# Patient Record
Sex: Female | Born: 1965
Health system: Southern US, Community
[De-identification: ages and names within clinical notes are randomized; demographics above are authoritative.]

## PROBLEM LIST (undated history)

## (undated) DIAGNOSIS — G509 Disorder of trigeminal nerve, unspecified: Secondary | ICD-10-CM

## (undated) DIAGNOSIS — F32A Depression, unspecified: Secondary | ICD-10-CM

## (undated) DIAGNOSIS — G500 Trigeminal neuralgia: Secondary | ICD-10-CM

## (undated) DIAGNOSIS — M1990 Unspecified osteoarthritis, unspecified site: Secondary | ICD-10-CM

## (undated) DIAGNOSIS — F419 Anxiety disorder, unspecified: Secondary | ICD-10-CM

## (undated) DIAGNOSIS — M797 Fibromyalgia: Secondary | ICD-10-CM

## (undated) DIAGNOSIS — E785 Hyperlipidemia, unspecified: Secondary | ICD-10-CM

## (undated) DIAGNOSIS — G709 Myoneural disorder, unspecified: Secondary | ICD-10-CM

## (undated) DIAGNOSIS — G2581 Restless legs syndrome: Secondary | ICD-10-CM

## (undated) DIAGNOSIS — T7840XA Allergy, unspecified, initial encounter: Secondary | ICD-10-CM

## (undated) DIAGNOSIS — K219 Gastro-esophageal reflux disease without esophagitis: Secondary | ICD-10-CM

## (undated) DIAGNOSIS — I10 Essential (primary) hypertension: Secondary | ICD-10-CM

## (undated) DIAGNOSIS — F329 Major depressive disorder, single episode, unspecified: Secondary | ICD-10-CM

## (undated) DIAGNOSIS — M7918 Myalgia, other site: Secondary | ICD-10-CM

## (undated) HISTORY — DX: Fibromyalgia: M79.7

## (undated) HISTORY — DX: Allergy, unspecified, initial encounter: T78.40XA

## (undated) HISTORY — DX: Gastro-esophageal reflux disease without esophagitis: K21.9

## (undated) HISTORY — DX: Unspecified osteoarthritis, unspecified site: M19.90

## (undated) HISTORY — PX: BRAIN SURGERY: SHX531

## (undated) HISTORY — DX: Anxiety disorder, unspecified: F41.9

## (undated) HISTORY — DX: Depression, unspecified: F32.A

## (undated) HISTORY — DX: Myalgia, other site: M79.18

## (undated) HISTORY — DX: Restless legs syndrome: G25.81

## (undated) HISTORY — DX: Hyperlipidemia, unspecified: E78.5

## (undated) HISTORY — DX: Essential (primary) hypertension: I10

## (undated) HISTORY — DX: Trigeminal neuralgia: G50.0

## (undated) HISTORY — DX: Myoneural disorder, unspecified: G70.9

## (undated) HISTORY — DX: Major depressive disorder, single episode, unspecified: F32.9

## (undated) HISTORY — DX: Disorder of trigeminal nerve, unspecified: G50.9

---

## 2016-07-01 DIAGNOSIS — F329 Major depressive disorder, single episode, unspecified: Secondary | ICD-10-CM | POA: Insufficient documentation

## 2016-07-01 DIAGNOSIS — I10 Essential (primary) hypertension: Secondary | ICD-10-CM | POA: Insufficient documentation

## 2016-07-01 DIAGNOSIS — F32A Depression, unspecified: Secondary | ICD-10-CM | POA: Insufficient documentation

## 2016-07-01 DIAGNOSIS — E7800 Pure hypercholesterolemia, unspecified: Secondary | ICD-10-CM | POA: Insufficient documentation

## 2016-08-22 DIAGNOSIS — G4730 Sleep apnea, unspecified: Secondary | ICD-10-CM | POA: Insufficient documentation

## 2017-02-19 DIAGNOSIS — G43711 Chronic migraine without aura, intractable, with status migrainosus: Secondary | ICD-10-CM | POA: Insufficient documentation

## 2017-03-11 LAB — HM MAMMOGRAPHY

## 2017-11-24 DIAGNOSIS — M797 Fibromyalgia: Secondary | ICD-10-CM | POA: Insufficient documentation

## 2018-01-06 ENCOUNTER — Encounter: Payer: Self-pay | Admitting: Physician Assistant

## 2018-01-06 ENCOUNTER — Ambulatory Visit (INDEPENDENT_AMBULATORY_CARE_PROVIDER_SITE_OTHER): Admitting: Physician Assistant

## 2018-01-06 ENCOUNTER — Ambulatory Visit: Payer: Self-pay | Admitting: Physician Assistant

## 2018-01-06 VITALS — BP 118/80 | HR 85 | Temp 97.9°F | Resp 16 | Wt 148.0 lb

## 2018-01-06 DIAGNOSIS — I10 Essential (primary) hypertension: Secondary | ICD-10-CM

## 2018-01-06 DIAGNOSIS — G500 Trigeminal neuralgia: Secondary | ICD-10-CM | POA: Diagnosis not present

## 2018-01-06 DIAGNOSIS — E785 Hyperlipidemia, unspecified: Secondary | ICD-10-CM | POA: Diagnosis not present

## 2018-01-06 DIAGNOSIS — M7918 Myalgia, other site: Secondary | ICD-10-CM | POA: Diagnosis not present

## 2018-01-06 DIAGNOSIS — M797 Fibromyalgia: Secondary | ICD-10-CM

## 2018-01-06 DIAGNOSIS — G509 Disorder of trigeminal nerve, unspecified: Secondary | ICD-10-CM

## 2018-01-06 DIAGNOSIS — G2581 Restless legs syndrome: Secondary | ICD-10-CM

## 2018-01-06 MED ORDER — HYDROCHLOROTHIAZIDE 12.5 MG PO CAPS: 12.5000 mg | ORAL_CAPSULE | Freq: Every day | ORAL | 0 refills | Status: DC

## 2018-01-06 MED ORDER — PRAVASTATIN SODIUM 40 MG PO TABS: 40.0000 mg | ORAL_TABLET | Freq: Every day | ORAL | 0 refills | Status: DC

## 2018-01-06 NOTE — Progress Notes (Signed)
Patient: Linda Gomez, Female    DOB: 03/25/1965, 52 y.o.   MRN: [id number] Visit Date: 01/14/2018  Today's Provider: Trey SailorsAdriana M Pollak, PA-C   Chief Complaint  Patient presents with  . New Patient (Initial Visit)   Subjective:    Annual physical exam Linda SageBrenda Gomez is a 52 y.o. female who presents today for new patient appointment. She feels fairly well due to neck pain. She reports exercising includes: water aerobics and yoga. She reports she is sleeping well.  P[place] today from [place]. Previously seen at [facility] in 11/2016. She moved her 2 months ago to the [place] area to be closer to family and closer to her doctors at [facility]. She lives with her husband of fifteen years. She has four grown stepchildren.   Trigeminal Neuralgia: Has a longstanding history of trigeminal neuralgia that she is seen by [facility] for. She underwent microvascular decompression in 2018 and followed up with [facility] in 07/2016 at which time she reported an improvement in her pain. She reports today however this surgery failed and this is why she is on Nucynta. Followed by [facility] for this.   Fibromyalgia: [facility] by [facility]. Used to take gabapentin but thought maybe this was causing her right arm pain.   Migraine Headache: Followed by [facility].   Pain Management: Followed by K[facility]i Mcuke for the above issues.   HTN: Takes 12.5 mg hctz.   HLD: Prescribed 40 mg pravachol. Has not taken this in several months.   Anxiety and Depression: Taking zoloft 50 mg. Has old prescription of ativan.   She is also complaining of neck pain that sometimes radiates into her shoulders. She denies weakness and loss of grip strength.  -----------------------------------------------------------------   Review of Systems  Constitutional: Positive for fatigue.  HENT: Positive for facial swelling.   Eyes: Positive for photophobia, redness and itching.  Respiratory: Negative.     Cardiovascular: Negative.   Gastrointestinal: Positive for constipation.  Endocrine: Negative.   Genitourinary: Negative.   Musculoskeletal: Positive for back pain, myalgias, neck pain and neck stiffness.  Skin: Negative.   Allergic/Immunologic: Negative.   Neurological: Positive for numbness and headaches.  Hematological: Negative.   Psychiatric/Behavioral: Positive for confusion and decreased concentration. The patient is nervous/anxious.     Social History She  reports that she has never smoked. She has never used smokeless tobacco. She reports current alcohol use of about 1.0 standard drinks of alcohol per week. She reports that she does not use drugs. Social History   Socioeconomic History  . Marital status: Married    Spouse name: Not on file  . Number of children: Not on file  . Years of education: Not on file  . Highest education level: Not on file  Occupational History  . Not on file  Social Needs  . Financial resource strain: Not on file  . Food insecurity:    Worry: Not on file    Inability: Not on file  . Transportation needs:    Medical: Not on file    Non-medical: Not on file  Tobacco Use  . Smoking status: Never Smoker  . Smokeless tobacco: Never Used  Substance and Sexual Activity  . Alcohol use: Yes    Alcohol/week: 1.0 standard drinks    Types: 1 Glasses of wine per week  . Drug use: Never  . Sexual activity: Not on file  Lifestyle  . Physical activity:    Days per week: Not on file  Minutes per session: Not on file  . Stress: Not on file  Relationships  . Social connections:    Talks on phone: Not on file    Gets together: Not on file    Attends religious service: Not on file    Active member of club or organization: Not on file    Attends meetings of clubs or organizations: Not on file    Relationship status: Not on file  Other Topics Concern  . Not on file  Social History Narrative  . Not on file    Patient Active Problem List    Diagnosis Date Noted  . Bilateral myofascial pain   . Trigeminal neuralgia   . Intractable migraine   . Trigeminal neuropathy   . Fibromyalgia   . Restless leg syndrome   . Trigeminal neuralgia     Past Surgical History:  Procedure Laterality Date  . BRAIN SURGERY    . CESAREAN SECTION      Family History  No family status information on file.   Her family history is not on file.     No Known Allergies  Previous Medications   CHOLECALCIFEROL (D 1000) 25 MCG (1000 UT) CHEW    Chew 1,000 Units by mouth daily.   FREMANEZUMAB-VFRM 225 MG/1.5ML SOSY    Inject 225 mg into the skin every 28 (twenty-eight) days.   GABAPENTIN (NEURONTIN) 600 MG TABLET    Take 600 mg by mouth.   GALCANEZUMAB-GNLM (EMGALITY) 120 MG/ML SOAJ    120 mg daily.   HYDROCHLOROTHIAZIDE (HYDRODIURIL) 12.5 MG TABLET    Take 12.5 mg by mouth daily.   LORAZEPAM (ATIVAN) 1 MG TABLET    Take 1 mg by mouth daily.   MAGNESIUM CITRATE SOLN    Take 296 mLs by mouth daily.   METHOCARBAMOL (ROBAXIN) 750 MG TABLET    Take 750 mg by mouth 3 (three) times daily. PRN   OXCARBAZEPINE (TRILEPTAL) 150 MG TABLET    Take 150 mg by mouth 2 (two) times daily.   SENNA (SENOKOT) 8.6 MG TABLET    Take 1 tablet by mouth daily.   SERTRALINE (ZOLOFT) 50 MG TABLET    Take 50 mg by mouth daily.   TAPENTADOL HCL (NUCYNTA) 75 MG TABLET    Take 75 mg by mouth 4 (four) times daily.   TIZANIDINE (ZANAFLEX) 4 MG TABLET    Take 4 mg by mouth daily.    Patient Care Team: Maryella Shivers as PCP - General (Physician Assistant)      Objective:   Vitals: BP 118/80 (BP Location: Left Arm, Patient Position: Sitting, Cuff Size: Normal)   Pulse 85   Temp 97.9 F (36.6 C) (Oral)   Resp 16   Wt 148 lb (67.1 kg)   SpO2 99%    Physical Exam Constitutional:      Appearance: Normal appearance.  Neck:     Musculoskeletal: Neck supple.  Cardiovascular:     Rate and Rhythm: Normal rate and regular rhythm.  Pulmonary:     Effort: Pulmonary  effort is normal.     Breath sounds: Normal breath sounds.  Abdominal:     General: Abdomen is flat.     Palpations: Abdomen is soft.  Musculoskeletal: Normal range of motion.        General: No swelling, tenderness or deformity.     Comments: No impingement signs, no weakness.   Skin:    General: Skin is warm and dry.  Neurological:  Mental Status: She is alert and oriented to person, place, and time. Mental status is at baseline.  Psychiatric:        Mood and Affect: Mood normal.        Behavior: Behavior normal.      Depression Screen PHQ 2/9 Scores 01/06/2018  PHQ - 2 Score 0  PHQ- 9 Score 8      Assessment & Plan:     Routine Health Maintenance and Physical Exam  Exercise Activities and Dietary recommendations Goals   None      There is no immunization history on file for this patient.  Health Maintenance  Topic Date Due  . HIV Screening  03/04/1979  . TETANUS/TDAP  03/04/1983  . PAP SMEAR-Modifier  03/03/1985  . MAMMOGRAM  03/03/2014  . COLONOSCOPY  03/03/2014  . INFLUENZA VACCINE  08/28/2017     Discussed health benefits of physical activity, and encouraged her to engage in regular exercise appropriate for her age and condition.    1. Hyperlipidemia, unspecified hyperlipidemia type  Check lipid panel and adjust accordingly.   - Lipid Profile - pravastatin (PRAVACHOL) 40 MG tablet; Take 1 tablet (40 mg total) by mouth daily.  Dispense: 90 tablet; Refill: 0  2. Hypertension, unspecified type  Controlled today. Continue HCTZ.   - CBC with Differential - TSH - Comprehensive Metabolic Panel (CMET) - hydrochlorothiazide (MICROZIDE) 12.5 MG capsule; Take 1 capsule (12.5 mg total) by mouth daily.  Dispense: 90 capsule; Refill: 0  3. Bilateral myofascial pain  She is followed by numerous specialtists at [facility].  4. Trigeminal neuralgia  Followed by [facility]   5. Trigeminal neuropathy  Followed by [facility].  6. Fibromyalgia  Followed by [facility].     7. Restless leg syndrome  Return in about 3 months (around 04/07/2018) for HTN HLD.  The entirety of the information documented in the History of Present Illness, Review of Systems and Physical Exam were personally obtained by me. Portions of this information were initially documented by Dara Lords, CMa and reviewed by me for thoroughness and accuracy.   I have spent 45 minutes with this patient, >50% of which was spent on counseling and coordination of care.     --------------------------------------------------------------------

## 2018-01-07 LAB — CBC WITH DIFFERENTIAL/PLATELET
Basophils Absolute: 0 10*3/uL (ref 0.0–0.2)
Basos: 1 %
EOS (ABSOLUTE): 0.1 10*3/uL (ref 0.0–0.4)
Eos: 2 %
Hematocrit: 35 % (ref 34.0–46.6)
Hemoglobin: 11.7 g/dL (ref 11.1–15.9)
Immature Grans (Abs): 0 10*3/uL (ref 0.0–0.1)
Immature Granulocytes: 0 %
Lymphocytes Absolute: 2.8 10*3/uL (ref 0.7–3.1)
Lymphs: 56 %
MCH: 28.7 pg (ref 26.6–33.0)
MCHC: 33.4 g/dL (ref 31.5–35.7)
MCV: 86 fL (ref 79–97)
Monocytes Absolute: 0.4 10*3/uL (ref 0.1–0.9)
Monocytes: 8 %
Neutrophils Absolute: 1.6 10*3/uL (ref 1.4–7.0)
Neutrophils: 33 %
Platelets: 267 10*3/uL (ref 150–450)
RBC: 4.08 x10E6/uL (ref 3.77–5.28)
RDW: 14.5 % (ref 12.3–15.4)
WBC: 4.9 10*3/uL (ref 3.4–10.8)

## 2018-01-07 LAB — LIPID PANEL
Chol/HDL Ratio: 3.4 ratio (ref 0.0–4.4)
Cholesterol, Total: 245 mg/dL — ABNORMAL HIGH (ref 100–199)
HDL: 72 mg/dL (ref >39)
LDL Calculated: 133 mg/dL — ABNORMAL HIGH (ref 0–99)
Triglycerides: 202 mg/dL — ABNORMAL HIGH (ref 0–149)
VLDL Cholesterol Cal: 40 mg/dL (ref 5–40)

## 2018-01-07 LAB — COMPREHENSIVE METABOLIC PANEL
ALT: 25 IU/L (ref 0–32)
AST: 16 IU/L (ref 0–40)
Albumin/Globulin Ratio: 1.9 (ref 1.2–2.2)
Albumin: 4.4 g/dL (ref 3.5–5.5)
Alkaline Phosphatase: 133 IU/L — ABNORMAL HIGH (ref 39–117)
BUN/Creatinine Ratio: 11 (ref 9–23)
BUN: 10 mg/dL (ref 6–24)
Bilirubin Total: <0.2 mg/dL (ref 0.0–1.2)
CO2: 21 mmol/L (ref 20–29)
Calcium: 9.2 mg/dL (ref 8.7–10.2)
Chloride: 105 mmol/L (ref 96–106)
Creatinine, Ser: 0.89 mg/dL (ref 0.57–1.00)
GFR calc Af Amer: 86 mL/min/{1.73_m2} (ref >59)
GFR calc non Af Amer: 74 mL/min/{1.73_m2} (ref >59)
Globulin, Total: 2.3 g/dL (ref 1.5–4.5)
Glucose: 88 mg/dL (ref 65–99)
Potassium: 4.1 mmol/L (ref 3.5–5.2)
Sodium: 139 mmol/L (ref 134–144)
Total Protein: 6.7 g/dL (ref 6.0–8.5)

## 2018-01-07 LAB — TSH: TSH: 1.42 u[IU]/mL (ref 0.450–4.500)

## 2018-01-08 ENCOUNTER — Telehealth: Payer: Self-pay

## 2018-01-08 NOTE — Telephone Encounter (Signed)
Patient states that she was out of her Pravastatin medication for 3 months and would like to come back in to get her labs redrawn to seen if she really needs the increase to 80 mg after getting back on the medication.

## 2018-01-08 NOTE — Telephone Encounter (Signed)
-----   Message from Trey SailorsAdriana M Pollak, [place] sent at 01/08/2018  9:49 AM EST ----- Her CBC looks normal with no anemia or infection. Her CMET is also normal with no diabetes, liver and kidney function look good. Cholesterol is elevated and above goal. Recommend increasing her pravastatin to 80 mg daily. OK to send in?

## 2018-01-09 NOTE — Telephone Encounter (Signed)
Patient advised as directed below. 

## 2018-01-09 NOTE — Telephone Encounter (Signed)
OK, will recheck labs at next visit.

## 2018-01-14 ENCOUNTER — Encounter: Payer: Self-pay | Admitting: Physician Assistant

## 2018-03-10 ENCOUNTER — Ambulatory Visit (INDEPENDENT_AMBULATORY_CARE_PROVIDER_SITE_OTHER): Admitting: Physician Assistant

## 2018-03-10 ENCOUNTER — Encounter: Payer: Self-pay | Admitting: Physician Assistant

## 2018-03-10 VITALS — BP 97/65 | HR 81 | Temp 97.8°F | Resp 16 | Ht 63.0 in | Wt 143.4 lb

## 2018-03-10 DIAGNOSIS — I10 Essential (primary) hypertension: Secondary | ICD-10-CM

## 2018-03-10 DIAGNOSIS — E785 Hyperlipidemia, unspecified: Secondary | ICD-10-CM | POA: Diagnosis not present

## 2018-03-10 DIAGNOSIS — Z0000 Encounter for general adult medical examination without abnormal findings: Secondary | ICD-10-CM | POA: Diagnosis not present

## 2018-03-10 DIAGNOSIS — G500 Trigeminal neuralgia: Secondary | ICD-10-CM | POA: Diagnosis not present

## 2018-03-10 DIAGNOSIS — Z1239 Encounter for other screening for malignant neoplasm of breast: Secondary | ICD-10-CM

## 2018-03-10 NOTE — Progress Notes (Signed)
Patient: Linda Gomez, Female    DOB: February 26, 1965, 53 y.o.   MRN: [id number] Visit Date: 03/16/2018  Today's Provider: Trey Sailors, PA-C   Chief Complaint  Patient presents with  . Annual Exam   Subjective:     Annual physical exam Linda Gomez is a 53 y.o. female who presents today for health maintenance and complete physical. She feels well. She reports exercising 2-3 days a week. She reports she is sleeping fairly well.  Colonoscopy: no polyps at age 67, Dr. 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 She was previously seeing pain management doctors in [place] and was prescribed Nucynta 75 mg QID. She has since established with pain management at [facility]. After visiting pain management at [facility] she was instructed to taper off NUcynta and follow up. She was then unable to follow up with pain management at [facility] due to provider cancellation and so returned to her pain doctor in [place] who refilled her pain medication. She did not taper off of this, she felt she could not do it. She reports her pain doctor in [place] told her he would no longer fill the medication as she has established somewhere else. She also reports her current pain management doctor "doesn't prescribe pain medication." She Wants to know what else can she do for pain. She is additionally on robaxin, gabapentin, Topamax. She takes ajovy for headache and has had trigger point injections. Has also tried SSRIs and lyrica previously.    -----------------------------------------------------------------   Review of Systems  Constitutional: Negative.   HENT: Negative.   Eyes: Positive for photophobia and itching.  Respiratory: Negative.   Cardiovascular: Negative.   Gastrointestinal: Negative.   Endocrine: Negative.   Genitourinary: Negative.   Musculoskeletal: Positive for arthralgias, back pain, myalgias, neck pain and neck stiffness.  Skin: Negative.   Allergic/Immunologic: Negative.   Neurological: Positive for numbness and headaches.  Hematological: Negative.   Psychiatric/Behavioral: Negative.     Social History      She  reports that she has never smoked. She has never used smokeless tobacco. She reports current alcohol use of about 1.0 standard drinks of alcohol per week. She reports that she does not use drugs.       Social History   Socioeconomic History  . Marital status: Married    Spouse name: Not on file  . Number of children: Not on file  . Years of education: Not on file  . Highest education level: Not on file  Occupational History  . Not on file  Social Needs  . Financial resource strain: Not on file  . Food insecurity:    Worry: Not on file    Inability: Not on file  . Transportation needs:    Medical: Not on file    Non-medical: Not on file  Tobacco Use  . Smoking status: Never Smoker  . Smokeless tobacco: Never Used  Substance and Sexual Activity  . Alcohol use: Yes    Alcohol/week: 1.0 standard drinks    Types: 1 Glasses of wine per week  . Drug use:  Never  . Sexual activity: Not on file  Lifestyle  . Physical activity:    Days per week: Not on file    Minutes per session: Not on file  . Stress: Not on file  Relationships  . Social connections:    Talks on phone: Not on file    Gets together: Not on file    Attends religious service: Not on file    Active member of club or organization: Not on file    Attends meetings of clubs or organizations: Not on file     Relationship status: Not on file  Other Topics Concern  . Not on file  Social History Narrative  . Not on file    Past Medical History:  Diagnosis Date  . Allergy   . Anxiety   . Arthritis   . Bilateral myofascial pain   . Depression   . Fibromyalgia   . GERD (gastroesophageal reflux disease)   . Hyperlipidemia   . Hypertension   . Neuromuscular disorder (HCC)   . Restless leg syndrome   . Trigeminal neuralgia   . Trigeminal neuralgia   . Trigeminal neuralgia   . Trigeminal neuropathy      Patient Active Problem List   Diagnosis Date Noted  . Bilateral myofascial pain   . Trigeminal neuralgia   . Intractable migraine   . Trigeminal neuropathy   . Fibromyalgia   . Restless leg syndrome   . Trigeminal neuralgia     Past Surgical History:  Procedure Laterality Date  . BRAIN SURGERY    . CESAREAN SECTION      Family History        No family status information on file.        Her family history is not on file.      No Known Allergies   Current Outpatient Medications:  .  Cholecalciferol (D 1000) 25 MCG (1000 UT) CHEW, Chew 1,000 Units by mouth daily., Disp: , Rfl:  .  magnesium citrate SOLN, Take 296 mLs by mouth daily., Disp: , Rfl:  .  methocarbamol (ROBAXIN) 750 MG tablet, Take 750 mg by mouth 3 (three) times daily. PRN, Disp: , Rfl:  .  pravastatin (PRAVACHOL) 40 MG tablet, Take 1 tablet (40 mg total) by mouth daily., Disp: 90 tablet, Rfl: 0 .  senna (SENOKOT) 8.6 MG tablet, Take 1 tablet by mouth daily., Disp: , Rfl:  .  tapentadol HCl (NUCYNTA) 75 MG tablet, Take 75 mg by mouth 4 (four) times daily., Disp: , Rfl:  .  Fremanezumab-vfrm 225 MG/1.5ML SOSY, Inject 225 mg into the skin every 28 (twenty-eight) days., Disp: , Rfl:  .  gabapentin (NEURONTIN) 600 MG tablet, Take 600 mg by mouth., Disp: , Rfl:  .  Galcanezumab-gnlm (EMGALITY) 120 MG/ML SOAJ, 120 mg daily., Disp: , Rfl:    Patient Care Team: Maryella ShiversPollak, Ladarrian Asencio M, PA-C as PCP - General (Physician  Assistant)    Objective:    Vitals: BP 97/65 (BP Location: Left Arm, Patient Position: Sitting, Cuff Size: Normal)   Pulse 81   Temp 97.8 F (36.6 C) (Oral)   Resp 16   Ht 5\' 3"  (1.6 m)   Wt 143 lb 6.4 oz (65 kg)   BMI 25.40 kg/m    Vitals:   03/10/18 1016  BP: 97/65  Pulse: 81  Resp: 16  Temp: 97.8 F (36.6 C)  TempSrc: Oral  Weight: 143 lb 6.4 oz (65 kg)  Height: 5\' 3"  (1.6 m)  Physical Exam Constitutional:      Appearance: Normal appearance.  Cardiovascular:     Rate and Rhythm: Normal rate and regular rhythm.     Pulses: Normal pulses.     Heart sounds: Normal heart sounds.  Pulmonary:     Effort: Pulmonary effort is normal.     Breath sounds: Normal breath sounds.  Chest:     Breasts: Breasts are symmetrical.        Right: Normal.        Left: Normal.  Abdominal:     General: Abdomen is flat.     Palpations: Abdomen is soft.  Skin:    General: Skin is warm and dry.  Neurological:     Mental Status: She is alert and oriented to person, place, and time. Mental status is at baseline.  Psychiatric:        Mood and Affect: Mood normal.        Behavior: Behavior normal.      Depression Screen PHQ 2/9 Scores 03/10/2018 01/06/2018  PHQ - 2 Score 0 0  PHQ- 9 Score 3 8       Assessment & Plan:     Routine Health Maintenance and Physical Exam  Exercise Activities and Dietary recommendations Goals   None      There is no immunization history on file for this patient.  Health Maintenance  Topic Date Due  . HIV Screening  03/04/1979  . TETANUS/TDAP  03/04/1983  . PAP SMEAR-Modifier  03/03/1985  . MAMMOGRAM  03/03/2014  . COLONOSCOPY  03/03/2014  . INFLUENZA VACCINE  08/28/2017     Discussed health benefits of physical activity, and encouraged her to engage in regular exercise appropriate for her age and condition.    1. Annual physical exam  Requesting records from prior providers.  2. Breast cancer screening  - MM Digital  Screening; Future  3. Hyperlipidemia, unspecified hyperlipidemia type  She is currently on pravastatin 40 mg daily. Cholesterol uncontrolled, will increase to pravastatin 80 mg daily.  - Lipid Profile  4. Trigeminal neuralgia  She has a very complicated history regarding painful syndromes including trigeminal neuralgia and fibromyalgia. She is followed by multiple specialists. Currently she feels like she has been passed around. She says when one medication wasn't working right, somebody would just change it. I have reviewed her charts and I see during her consultation with [facility] Ronelle [facility]ortz-Marks at [facility] she was instructed to taper off NUcynta and reschedule with another physician. She did not do this and instead returned to her [place] pain management doctor to get nucynta. I have been very explicit with her that she will need to be seeing only one pain management doctor and she cannot go back and forth between doctors when she does not like the plan suggested by one. She must come to an agreement with the provider she is seeing and must state her concerns, such as if the taper would be too slow. AT this point, she has tried and failed multiple conservative measures regarding pain management and I do not have any suggestions related to his. She needs to follow up with her pain specialists.  5. Hypertension, unspecified type  She is very low normal, I do not think she needs HCTZ and I will stop this.  The entirety of the information documented in the History of Present Illness, Review of Systems and Physical Exam were personally obtained by me. Portions of this information were initially documented by Rondel BatonSulibeya Dimas, CMA and reviewed  by me for thoroughness and accuracy.   Return in about 6 months (around 09/08/2018) for HLD, HTN.  --------------------------------------------------------------------    Trey Sailors, PA-C  [facility]

## 2018-03-11 LAB — LIPID PANEL
Chol/HDL Ratio: 2.9 ratio (ref 0.0–4.4)
Cholesterol, Total: 202 mg/dL — ABNORMAL HIGH (ref 100–199)
HDL: 70 mg/dL (ref >39)
LDL Calculated: 116 mg/dL — ABNORMAL HIGH (ref 0–99)
Triglycerides: 81 mg/dL (ref 0–149)
VLDL Cholesterol Cal: 16 mg/dL (ref 5–40)

## 2018-03-31 ENCOUNTER — Telehealth: Payer: Self-pay

## 2018-03-31 DIAGNOSIS — G500 Trigeminal neuralgia: Secondary | ICD-10-CM

## 2018-03-31 DIAGNOSIS — G8929 Other chronic pain: Secondary | ICD-10-CM

## 2018-03-31 NOTE — Telephone Encounter (Signed)
Patient called requesting a referral to pain management. She states that the last time she was seen in the office, she was told to call back if she wanted a referral. Please call patient back at [phone]

## 2018-04-01 NOTE — Telephone Encounter (Signed)
Pt is calling to check on this.  She states she would like pain doctor in [place], [place] preferably.   But is also willing to go to [facility]    Thanks,   -Vernona Rieger

## 2018-04-01 NOTE — Telephone Encounter (Signed)
Patient reports that she spoke with [facility] and they told her they would see her but needed a referral.

## 2018-04-01 NOTE — Telephone Encounter (Signed)
Patient reports that they don't want to continue seeing her because they where only seeing her temporarily and they are in [place].. She would await for someone to get in contact for her.

## 2018-04-01 NOTE — Telephone Encounter (Signed)
Referral placed, she will be contacted. I would recommend she continue to attend her current pain management appointments until another clinic accepts the referral.

## 2018-04-02 ENCOUNTER — Telehealth: Payer: Self-pay

## 2018-04-02 ENCOUNTER — Other Ambulatory Visit: Payer: Self-pay | Admitting: Physician Assistant

## 2018-04-02 DIAGNOSIS — E785 Hyperlipidemia, unspecified: Secondary | ICD-10-CM

## 2018-04-02 MED ORDER — PRAVASTATIN SODIUM 80 MG PO TABS: 80.0000 mg | ORAL_TABLET | Freq: Every day | ORAL | 1 refills | Status: DC

## 2018-04-02 NOTE — Telephone Encounter (Signed)
FYI

## 2018-04-02 NOTE — Telephone Encounter (Signed)
Per Ezzard Standing at [facility] pain clinic pt will need to get auth from [place] doctor for services to be covered other wise she can come in as a self pay pt

## 2018-04-02 NOTE — Telephone Encounter (Signed)
[id number] 

## 2018-04-02 NOTE — Telephone Encounter (Signed)
Pt spoke with pain clinic and has straighten out the issue with pain clinic.Just FYI

## 2018-04-02 NOTE — Telephone Encounter (Signed)
-----   Message from Trey Sailors, [place] sent at 04/02/2018  8:18 AM EST ----- Cholesterol uncontrolled, please increase pravastatin to 80 mg. Will send this in.

## 2018-04-10 ENCOUNTER — Other Ambulatory Visit: Payer: Self-pay | Admitting: Physician Assistant

## 2018-04-10 DIAGNOSIS — I10 Essential (primary) hypertension: Secondary | ICD-10-CM

## 2018-04-10 DIAGNOSIS — E785 Hyperlipidemia, unspecified: Secondary | ICD-10-CM

## 2018-04-15 ENCOUNTER — Encounter: Payer: Self-pay | Admitting: Nurse Practitioner

## 2018-04-15 ENCOUNTER — Ambulatory Visit (HOSPITAL_BASED_OUTPATIENT_CLINIC_OR_DEPARTMENT_OTHER): Admitting: Nurse Practitioner

## 2018-04-15 ENCOUNTER — Encounter (INDEPENDENT_AMBULATORY_CARE_PROVIDER_SITE_OTHER): Payer: Self-pay

## 2018-04-15 ENCOUNTER — Other Ambulatory Visit: Payer: Self-pay

## 2018-04-15 ENCOUNTER — Ambulatory Visit
Admission: RE | Admit: 2018-04-15 | Discharge: 2018-04-15 | Disposition: A | Discharge status: Home / Self Care | Source: Ambulatory Visit | Attending: Nurse Practitioner | Admitting: Nurse Practitioner

## 2018-04-15 VITALS — BP 128/82 | HR 87 | Temp 98.0°F | Resp 16 | Ht 63.0 in | Wt 142.0 lb

## 2018-04-15 DIAGNOSIS — G8929 Other chronic pain: Secondary | ICD-10-CM

## 2018-04-15 DIAGNOSIS — M899 Disorder of bone, unspecified: Secondary | ICD-10-CM

## 2018-04-15 DIAGNOSIS — R51 Headache: Secondary | ICD-10-CM | POA: Insufficient documentation

## 2018-04-15 DIAGNOSIS — Z79899 Other long term (current) drug therapy: Secondary | ICD-10-CM

## 2018-04-15 DIAGNOSIS — M542 Cervicalgia: Principal | ICD-10-CM

## 2018-04-15 DIAGNOSIS — G894 Chronic pain syndrome: Secondary | ICD-10-CM | POA: Diagnosis not present

## 2018-04-15 DIAGNOSIS — Z789 Other specified health status: Secondary | ICD-10-CM | POA: Insufficient documentation

## 2018-04-15 DIAGNOSIS — Z79891 Long term (current) use of opiate analgesic: Secondary | ICD-10-CM

## 2018-04-15 DIAGNOSIS — R519 Headache, unspecified: Secondary | ICD-10-CM | POA: Insufficient documentation

## 2018-04-15 NOTE — Progress Notes (Signed)
Patient's Name: Linda Gomez  MRN: [id number]  Referring Provider: Trinna Post, PA-C  DOB: Feb 18, 1965  PCP: Trinna Post, PA-C  DOS: 04/15/2018  Note by: Dionisio David NP  Service setting: Ambulatory outpatient  Specialty: Interventional Pain Management  Location: [facility] (AMB) Pain Management Facility    Patient type: New Patient    Primary Reason(s) for Visit: Initial Patient Evaluation CC: Generalized Body Aches (fibromyalgia); Migraine (intractable ); and Facial Pain (trigeminal pain  s/p surgery )  HPI  Ms. Hayworth is a 53 y.o. year old, female patient, who comes today for an initial evaluation. She has Bilateral myofascial pain; Trigeminal neuralgia; Intractable chronic migraine without aura and with status migrainosus; Trigeminal neuropathy; Fibromyalgia; Restless leg syndrome; Trigeminal neuralgia; Depression; High cholesterol; Hypertension, essential; Observed sleep apnea; Chronic facial pain (Primary Area of Pain) (left); Chronic neck pain (Secondary Area of Pain) (L>R); Chronic pain syndrome; Long term current use of opiate analgesic; Pharmacologic therapy; Disorder of skeletal system; and Problems influencing health status on their problem list.. Her primarily concern today is the Generalized Body Aches (fibromyalgia); Migraine (intractable ); and Facial Pain (trigeminal pain  s/p surgery )  Pain Assessment: Location: Left Face(see visit info for additional pain sites ) Radiating: sometimes facial pain goes to right cheek  Onset: More than a month ago Duration: Chronic pain Quality: Tingling, Constant, Discomfort, Dull, Nagging Severity: 2 /10 (subjective, self-reported pain score)  Note: Reported level is compatible with observation.                          Effect on ADL: if in pain she doesn't want to do anything.   Timing: Constant Modifying factors: medications, heat.  counter pressure, relaxation.  exercise BP: 128/82  HR: 87  Onset and Duration: Gradual and Present  longer than 3 months Cause of pain: suspect dental implant surgery Severity: NAS-11 at its worse: 9/10, NAS-11 at its best: 9/10, NAS-11 now: 2/10 and NAS-11 on the average: 2/10 Timing: Morning and Not influenced by the time of the day Aggravating Factors: Bending, Prolonged sitting, Prolonged standing and Working Alleviating Factors: Stretching, Hot packs, Lying down, Medications, Resting, Sleeping, Warm showers or baths and Chiropractic manipulations Associated Problems: Inability to concentrate, Numbness, Tingling and Pain that wakes patient up Quality of Pain: Aching, Annoying, Dull, Nagging, Pulsating, Throbbing, Tingling, Toothache-like and Uncomfortable Previous Examinations or Tests: CT scan, MRI scan, Nerve block, Neurological evaluation, Neurosurgical evaluation, Orthopedic evaluation and Psychiatric evaluation Previous Treatments: Chiropractic manipulations, Narcotic medications, Physical Therapy and Trigger point injections  The patient comes into the clinics today for the first time for a chronic pain management evaluation.  According to the patient her primary area of pain is on the left side of her face.  She has pain that radiates down into the side of her face and into her jaw.  He has numbness, tingling and burning pain that is nagging.  She admits that she has a diagnosis of trigeminal neuralgia.  She is status post retromastoid craniectomy microvascular decompression June 2018.  She is also had trigeminal nerve blocks.  She does not feel any real difference.  She has been seen by a headache specialist and a history of pain specialist, all within the [facility] system. Currently on Nucynta and gabapentin.  She feels like the gabapentin may be causing some memory changes so she is tapering down on this.  She admits that she is also involved in a study and has had a  recent MRI at [facility].  Her second area of pain is in her neck.  She has a history of headaches but does not feel like this is  related.  She admits that the headaches are well managed.  She feels like the pain goes down both sides.  She has any arm pain.  She denies any previous surgery.  She has had trigger point injections.   Today I took the time to provide the patient with information regarding this pain practice. The patient was informed that the practice is divided into two sections: an interventional pain management section, as well as a completely separate and distinct medication management section. I explained that there are procedure days for interventional therapies, and evaluation days for follow-ups and medication management. Because of the amount of documentation required during both, they are kept separated. This means that there is the possibility that she may be scheduled for a procedure on one day, and medication management the next. I have also informed her that because of staffing and facility limitations, this practice will no longer take patients for medication management only. To illustrate the reasons for this, I gave the patient the example of surgeons, and how inappropriate it would be to refer a patient to his/her care, just to write for the post-surgical antibiotics on a surgery done by a different surgeon.   Because interventional pain management is part of the board-certified specialty for the doctors, the patient was informed that joining this practice means that they are open to any and all interventional therapies. I made it clear that this does not mean that they will be forced to have any procedures done. What this means is that I believe interventional therapies to be essential part of the diagnosis and proper management of chronic pain conditions. Therefore, patients not interested in these interventional alternatives will be better served under the care of a different practitioner.  The patient was also made aware of my Comprehensive Pain Management Safety Guidelines where by joining this  practice, they limit all of their nerve blocks and joint injections to those done by our practice, for as long as we are retained to manage their care. Historic Controlled Substance Pharmacotherapy Review  PMP and historical list of controlled substances: Nucynta 75 mg, lorazepam 1 mg, zolpidem ER 6.25 mg, oxycodone 5 mg, hydrocodone/acetaminophen 7.5/325 mg, Nucynta ER 50 mg, hydrocodone/Chlorphen ER suspension, hydrocodone/acetaminophen 5/325 mg, phentermine 37.5 mg, Highest opioid analgesic regimen found: Nucynta 75 mg 1 tablet 4 times daily (fill date 02/26/2018) Nucynta 300 mg/day Most recent opioid analgesic: Nucynta 75 mg 1 tablet 4 times daily (fill date 02/26/2018) Nucynta 300 mg/day Current opioid analgesics:  Nucynta 75 mg 1 tablet 4 times daily (fill date 02/26/2018) Nucynta 300 mg/day Highest recorded MME/day: 120 mg/day MME/day: 120 mg/day Medications: The patient did not bring the medication(s) to the appointment, as requested in our "New Patient Package" Pharmacodynamics: Desired effects: Analgesia: The patient reports >50% benefit. Reported improvement in function: The patient reports medication allows her to accomplish basic ADLs. Clinically meaningful improvement in function (CMIF): Sustained CMIF goals met Perceived effectiveness: Described as relatively effective, allowing for increase in activities of daily living (ADL) Undesirable effects: Side-effects or Adverse reactions: None reported Historical Monitoring: The patient  reports no history of drug use. List of all UDS Test(s): No results found for: MDMA, COCAINSCRNUR, PCPSCRNUR, PCPQUANT, CANNABQUANT, THCU, [place] List of all Serum Drug Screening Test(s):  No results found for: AMPHSCRSER, BARBSCRSER, BENZOSCRSER, COCAINSCRSER, PCPSCRSER, PCPQUANT, THCSCRSER, CANNABQUANT,  OPIATESCRSER, OXYSCRSER, PROPOXSCRSER Historical Background Evaluation: [place] PDMP: Six (6) year initial data search conducted.             [place] Department of  public safety, offender search: Editor, commissioning Information) Non-contributory Risk Assessment Profile: Aberrant behavior: None observed or detected today Risk factors for fatal opioid overdose: None identified today Fatal overdose hazard ratio (HR): Calculation deferred Non-fatal overdose hazard ratio (HR): Calculation deferred Risk of opioid abuse or dependence: 0.7-3.0% with doses ? 36 MME/day and 6.1-26% with doses ? 120 MME/day. Substance use disorder (SUD) risk level: Pending results of Medical Psychology Evaluation for SUD Opioid risk tool (ORT) (Total Score): 1  ORT Scoring interpretation table:  Score <3 = Low Risk for SUD  Score between 4-7 = Moderate Risk for SUD  Score >8 = High Risk for Opioid Abuse   PHQ-2 Depression Scale:  Total score:    PHQ-2 Scoring interpretation table: (Score and probability of major depressive disorder)  Score 0 = No depression  Score 1 = 15.4% Probability  Score 2 = 21.1% Probability  Score 3 = 38.4% Probability  Score 4 = 45.5% Probability  Score 5 = 56.4% Probability  Score 6 = 78.6% Probability   PHQ-9 Depression Scale:  Total score:    PHQ-9 Scoring interpretation table:  Score 0-4 = No depression  Score 5-9 = Mild depression  Score 10-14 = Moderate depression  Score 15-19 = Moderately severe depression  Score 20-27 = Severe depression (2.4 times higher risk of SUD and 2.89 times higher risk of overuse)   Pharmacologic Plan: Pending ordered tests and/or consults  Meds  The patient has a current medication list which includes the following prescription(s): cholecalciferol, dihydroergotamine, fremanezumab-vfrm, gabapentin, pravastatin, and tapentadol hcl.  Current Outpatient Medications on File Prior to Visit  Medication Sig  . Cholecalciferol (D 1000) 25 MCG (1000 UT) CHEW Chew 1,000 Units by mouth daily.  Marland Kitchen dihydroergotamine (MIGRANAL) 4 MG/ML nasal spray Place 1 spray into the nose as needed.  . Fremanezumab-vfrm 225 MG/1.5ML SOSY Inject  225 mg into the skin every 28 (twenty-eight) days.  Marland Kitchen gabapentin (NEURONTIN) 600 MG tablet Take 600 mg by mouth 2 (two) times daily. taking 600 mg qhs  . pravastatin (PRAVACHOL) 80 MG tablet Take 1 tablet (80 mg total) by mouth daily.  . tapentadol HCl (NUCYNTA) 75 MG tablet Take 75 mg by mouth 4 (four) times daily.   No current facility-administered medications on file prior to visit.    Imaging Review  Note: Available results from prior imaging studies were reviewed.        ROS  Cardiovascular History: No reported cardiovascular signs or symptoms such as High blood pressure, coronary artery disease, abnormal heart rate or rhythm, heart attack, blood thinner therapy or heart weakness and/or failure Pulmonary or Respiratory History: No reported pulmonary signs or symptoms such as wheezing and difficulty taking a deep full breath (Asthma), difficulty blowing air out (Emphysema), coughing up mucus (Bronchitis), persistent dry cough, or temporary stoppage of breathing during sleep Neurological History: No reported neurological signs or symptoms such as seizures, abnormal skin sensations, urinary and/or fecal incontinence, being born with an abnormal open spine and/or a tethered spinal cord Review of Past Neurological Studies: No results found for this or any previous visit. Psychological-Psychiatric History: No reported psychological or psychiatric signs or symptoms such as difficulty sleeping, anxiety, depression, delusions or hallucinations (schizophrenial), mood swings (bipolar disorders) or suicidal ideations or attempts Gastrointestinal History: No reported gastrointestinal signs or symptoms such as  vomiting or evacuating blood, reflux, heartburn, alternating episodes of diarrhea and constipation, inflamed or scarred liver, or pancreas or irrregular and/or infrequent bowel movements Genitourinary History: No reported renal or genitourinary signs or symptoms such as difficulty voiding or producing  urine, peeing blood, non-functioning kidney, kidney stones, difficulty emptying the bladder, difficulty controlling the flow of urine, or chronic kidney disease Hematological History: No reported hematological signs or symptoms such as prolonged bleeding, low or poor functioning platelets, bruising or bleeding easily, hereditary bleeding problems, low energy levels due to low hemoglobin or being anemic Endocrine History: No reported endocrine signs or symptoms such as high or low blood sugar, rapid heart rate due to high thyroid levels, obesity or weight gain due to slow thyroid or thyroid disease Rheumatologic History: No reported rheumatological signs and symptoms such as fatigue, joint pain, tenderness, swelling, redness, heat, stiffness, decreased range of motion, with or without associated rash Musculoskeletal History: Negative for myasthenia gravis, muscular dystrophy, multiple sclerosis or malignant hyperthermia Work History: Unemployed  Allergies  Ms. Arnette has No Known Allergies.  Laboratory Chemistry  Inflammation Markers No results found for: CRP, ESRSEDRATE (CRP: Acute Phase) (ESR: Chronic Phase) Renal Function Markers Lab Results  Component Value Date   BUN 10 01/06/2018   CREATININE 0.89 01/06/2018   GFRAA 86 01/06/2018   GFRNONAA 74 01/06/2018   Hepatic Function Markers Lab Results  Component Value Date   AST 16 01/06/2018   ALT 25 01/06/2018   ALBUMIN 4.4 01/06/2018   ALKPHOS 133 (H) 01/06/2018   Electrolytes Lab Results  Component Value Date   NA 139 01/06/2018   K 4.1 01/06/2018   CL 105 01/06/2018   CALCIUM 9.2 01/06/2018   Neuropathy Markers No results found for: [id number] Bone Pathology Markers Lab Results  Component Value Date   ALKPHOS 133 (H) 01/06/2018   CALCIUM 9.2 01/06/2018   Coagulation Parameters Lab Results  Component Value Date   PLT 267 01/06/2018   Cardiovascular Markers Lab Results  Component Value Date   HGB 11.7 01/06/2018    HCT 35.0 01/06/2018   Note: Lab results reviewed.  [place]  Drug: Ms. Spiewak  reports no history of drug use. Alcohol:  reports current alcohol use of about 1.0 standard drinks of alcohol per week. Tobacco:  reports that she has never smoked. She has never used smokeless tobacco. Medical:  has a past medical history of Allergy, Anxiety, Arthritis, Bilateral myofascial pain, Depression, Fibromyalgia, GERD (gastroesophageal reflux disease), Hyperlipidemia, Hypertension, Neuromuscular disorder ([place]), Restless leg syndrome, Trigeminal neuralgia, Trigeminal neuralgia, Trigeminal neuralgia, and Trigeminal neuropathy. Family: family history is not on file.  Past Surgical History:  Procedure Laterality Date  . BRAIN SURGERY    . CESAREAN SECTION     Active Ambulatory Problems    Diagnosis Date Noted  . Bilateral myofascial pain   . Trigeminal neuralgia 06/13/2016  . Intractable chronic migraine without aura and with status migrainosus 02/19/2017  . Trigeminal neuropathy   . Fibromyalgia 11/24/2017  . Restless leg syndrome   . Trigeminal neuralgia   . Depression 07/01/2016  . High cholesterol 07/01/2016  . Hypertension, essential 07/01/2016  . Observed sleep apnea 08/22/2016  . Chronic facial pain (Primary Area of Pain) (left) 04/15/2018  . Chronic neck pain (Secondary Area of Pain) (L>R) 04/15/2018  . Chronic pain syndrome 04/15/2018  . Long term current use of opiate analgesic 04/15/2018  . Pharmacologic therapy 04/15/2018  . Disorder of skeletal system 04/15/2018  . Problems influencing health status 04/15/2018  Resolved Ambulatory Problems    Diagnosis Date Noted  . No Resolved Ambulatory Problems   Past Medical History:  Diagnosis Date  . Allergy   . Anxiety   . Arthritis   . GERD (gastroesophageal reflux disease)   . Hyperlipidemia   . Hypertension   . Neuromuscular disorder ([place])    Constitutional Exam  General appearance: Well nourished, well developed, and well  hydrated. In no apparent acute distress Vitals:   04/15/18 1017  BP: 128/82  Pulse: 87  Resp: 16  Temp: 98 F (36.7 C)  TempSrc: Oral  SpO2: 100%  Weight: 142 lb (64.4 kg)  Height: 5' 3"  (1.6 m)   BMI Assessment: Estimated body mass index is 25.15 kg/m as calculated from the following:   Height as of this encounter: 5' 3"  (1.6 m).   Weight as of this encounter: 142 lb (64.4 kg).  BMI interpretation table: BMI level Category Range association with higher incidence of chronic pain  <18 kg/m2 Underweight   18.5-24.9 kg/m2 Ideal body weight   25-29.9 kg/m2 Overweight Increased incidence by 20%  30-34.9 kg/m2 Obese (Class I) Increased incidence by 68%  35-39.9 kg/m2 Severe obesity (Class II) Increased incidence by 136%  >40 kg/m2 Extreme obesity (Class III) Increased incidence by 254%   BMI Readings from Last 4 Encounters:  04/15/18 25.15 kg/m  03/10/18 25.40 kg/m   Wt Readings from Last 4 Encounters:  04/15/18 142 lb (64.4 kg)  03/10/18 143 lb 6.4 oz (65 kg)  01/06/18 148 lb (67.1 kg)  Psych/Mental status: Alert, oriented x 3 (person, place, & time)       Eyes: PERLA Respiratory: No evidence of acute respiratory distress Cranial nerves: 2-12 intact Cervical Spine Exam  Inspection: No masses, redness, or swelling Alignment: Symmetrical Functional ROM: Decreased ROM     Lateral rotation to the right Stability: No instability detected Muscle strength & Tone: Guarding observed Sensory: Movement-associated discomfort Palpation: Increased muscle tone              Upper Extremity (UE) Exam    Side: Right upper extremity  Side: Left upper extremity  Inspection: No masses, redness, swelling, or asymmetry. No contractures  Inspection: No masses, redness, swelling, or asymmetry. No contractures  Functional ROM: Unrestricted ROM          Functional ROM: Unrestricted ROM          Muscle strength & Tone: Functionally intact  Muscle strength & Tone: Functionally intact  Sensory:  Unimpaired  Sensory: Unimpaired  Palpation: No palpable anomalies              Palpation: No palpable anomalies              Specialized Test(s): Deferred         Specialized Test(s): Deferred          Thoracic Spine Exam  Inspection: No masses, redness, or swelling Alignment: Symmetrical Functional ROM: Unrestricted ROM Stability: No instability detected Sensory: Unimpaired Muscle strength & Tone: No palpable anomalies  Lumbar Spine Exam  Inspection: No masses, redness, or swelling Alignment: Symmetrical Functional ROM: Unrestricted ROM      Stability: No instability detected Muscle strength & Tone: Functionally intact Sensory: Unimpaired Palpation: No palpable anomalies       Provocative Tests: Lumbar Hyperextension and rotation test: evaluation deferred today       Patrick's Maneuver: evaluation deferred today  Gait & Posture Assessment  Ambulation: Unassisted Gait: Relatively normal for age and body habitus Posture: WNL   Lower Extremity Exam    Side: Right lower extremity  Side: Left lower extremity  Inspection: No masses, redness, swelling, or asymmetry. No contractures  Inspection: No masses, redness, swelling, or asymmetry. No contractures  Functional ROM: Unrestricted ROM          Functional ROM: Unrestricted ROM          Muscle strength & Tone: Functionally intact  Muscle strength & Tone: Functionally intact  Sensory: Unimpaired  Sensory: Unimpaired  Palpation: No palpable anomalies  Palpation: No palpable anomalies   Assessment  Primary Diagnosis & Pertinent Problem List: The primary encounter diagnosis was Chronic facial pain (Primary Area of Pain) (left). Diagnoses of Chronic neck pain (Secondary Area of Pain) (L>R), Chronic pain syndrome, Long term current use of opiate analgesic, Pharmacologic therapy, Disorder of skeletal system, and Problems influencing health status were also pertinent to this visit.  Visit Diagnosis: 1. Chronic facial  pain (Primary Area of Pain) (left)   2. Chronic neck pain (Secondary Area of Pain) (L>R)   3. Chronic pain syndrome   4. Long term current use of opiate analgesic   5. Pharmacologic therapy   6. Disorder of skeletal system   7. Problems influencing health status    Plan of Care  Initial treatment plan:  Please be advised that as per protocol, today's visit has been an evaluation only. We have not taken over the patient's controlled substance management.  Problem-specific plan: No problem-specific Assessment & Plan notes found for this encounter.  Ordered Lab-work, Procedure(s), Referral(s), & Consult(s): Orders Placed This Encounter  Procedures  . DG Cervical Spine With Flex & Extend  . Compliance Drug Analysis, Ur  . Comp. Metabolic Panel (12)  . Magnesium  . Vitamin B12  . Sedimentation rate  . 25-Hydroxyvitamin D Lcms D2+D3  . C-reactive protein   Pharmacotherapy: Medications ordered:  No orders of the defined types were placed in this encounter.  Medications administered during this visit: Darien Ramus had no medications administered during this visit.   Pharmacotherapy under consideration:  Opioid Analgesics: The patient was informed that there is no guarantee that she would be a candidate for opioid analgesics. The decision will be made following CDC guidelines. This decision will be based on the results of diagnostic studies, as well as Ms. Schwenke's risk profile.  Membrane stabilizer: To be determined at a later time Muscle relaxant: To be determined at a later time NSAID: To be determined at a later time Other analgesic(s): To be determined at a later time   Interventional therapies under consideration: Ms. Galka was informed that there is no guarantee that she would be a candidate for interventional therapies. The decision will be based on the results of diagnostic studies, as well as Ms. Soulier's risk profile.  Possible procedure(s): Diagnostic left trigeminal  nerve block Diagnostic left greater occipital nerve block Diagnostic trigger point injections Diagnostic bilateral cervical facet nerve blocks Possible bilateral cervical facet radiofrequency ablation   Provider-requested follow-up: Return for 2nd Visit (Post-test), medical record release.  Future Appointments  Date Time Provider [place]  05/11/2018 10:30 AM Milinda Pointer, MD [facility] None    Primary Care Physician: Paulene Floor Location: [facility] Outpatient Pain Management Facility Note by:  Date: 04/15/2018; Time: 3:25 PM  Pain Score Disclaimer: We use the NRS-11 scale. This is a self-reported, subjective measurement of pain severity with only modest  accuracy. It is used primarily to identify changes within a particular patient. It must be understood that outpatient pain scales are significantly less accurate that those used for research, where they can be applied under ideal controlled circumstances with minimal exposure to variables. In reality, the score is likely to be a combination of pain intensity and pain affect, where pain affect describes the degree of emotional arousal or changes in action readiness caused by the sensory experience of pain. Factors such as social and work situation, setting, emotional state, anxiety levels, expectation, and prior pain experience may influence pain perception and show large inter-individual differences that may also be affected by time variables.  Patient instructions provided during this appointment: Patient Instructions   ____________________________________________________________________________________________  Appointment Policy Summary  It is our goal and responsibility to provide the medical community with assistance in the evaluation and management of patients with chronic pain. Unfortunately our resources are limited. Because we do not have an unlimited amount of time, or available appointments, we are required  to closely monitor and manage their use. The following rules exist to maximize their use:  Patient's responsibilities: 1. Punctuality:  At what time should I arrive? You should be physically present in our office 30 minutes before your scheduled appointment. Your scheduled appointment is with your assigned healthcare provider. However, it takes 5-10 minutes to be "checked-in", and another 15 minutes for the nurses to do the admission. If you arrive to our office at the time you were given for your appointment, you will end up being at least 20-25 minutes late to your appointment with the provider. 2. Tardiness:  What happens if I arrive only a few minutes after my scheduled appointment time? You will need to reschedule your appointment. The cutoff is your appointment time. This is why it is so important that you arrive at least 30 minutes before that appointment. If you have an appointment scheduled for 10:00 AM and you arrive at 10:01, you will be required to reschedule your appointment.  3. Plan ahead:  Always assume that you will encounter traffic on your way in. Plan for it. If you are dependent on a driver, make sure they understand these rules and the need to arrive early. 4. Other appointments and responsibilities:  Avoid scheduling any other appointments before or after your pain clinic appointments.  5. Be prepared:  Write down everything that you need to discuss with your healthcare provider and give this information to the admitting nurse. Write down the medications that you will need refilled. Bring your pills and bottles (even the empty ones), to all of your appointments, except for those where a procedure is scheduled. 6. No children or pets:  Find someone to take care of them. It is not appropriate to bring them in. 7. Scheduling changes:  We request "advanced notification" of any changes or cancellations. 8. Advanced notification:  Defined as a time period of more than 24 hours  prior to the originally scheduled appointment. This allows for the appointment to be offered to other patients. 9. Rescheduling:  When a visit is rescheduled, it will require the cancellation of the original appointment. For this reason they both fall within the category of "Cancellations".  10. Cancellations:  They require advanced notification. Any cancellation less than 24 hours before the  appointment will be recorded as a "No Show". 11. No Show:  Defined as an unkept appointment where the patient failed to notify or declare to the practice their intention or inability  to keep the appointment.  Corrective process for repeat offenders:  1. Tardiness: Three (3) episodes of rescheduling due to late arrivals will be recorded as one (1) "No Show". 2. Cancellation or reschedule: Three (3) cancellations or rescheduling will be recorded as one (1) "No Show". 3. "No Shows": Three (3) "No Shows" within a 12 month period will result in discharge from the practice. ____________________________________________________________________________________________   ______________________________________________________________________________________________  Specialty Pain Scale  Introduction:  There are significant differences in how pain is reported. The word pain usually refers to physical pain, but it is also a common synonym of suffering. The medical community uses a scale from 0 (zero) to 10 (ten) to report pain level. Zero (0) is described as "no pain", while ten (10) is described as "the worse pain you can imagine". The problem with this scale is that physical pain is reported along with suffering. Suffering refers to mental pain, or more often yet it refers to any unpleasant feeling, emotion or aversion associated with the perception of harm or threat of harm. It is the psychological component of pain.  Pain Specialists prefer to separate the two components. The pain scale used by this practice is  the Verbal Numerical Rating Scale (VNRS-11). This scale is for the physical pain only. DO NOT INCLUDE how your pain psychologically affects you. This scale is for adults 61 years of age and older. It has 11 (eleven) levels. The 1st level is 0/10. This means: "right now, I have no pain". In the context of pain management, it also means: "right now, my physical pain is under control with the current therapy".  General Information:  The scale should reflect your current level of pain. Unless you are specifically asked for the level of your worst pain, or your average pain. If you are asked for one of these two, then it should be understood that it is over the past 24 hours.  Levels 1 (one) through 5 (five) are described below, and can be treated as an outpatient. Ambulatory pain management facilities such as ours are more than adequate to treat these levels. Levels 6 (six) through 10 (ten) are also described below, however, these must be treated as a hospitalized patient. While levels 6 (six) and 7 (seven) may be evaluated at an urgent care facility, levels 8 (eight) through 10 (ten) constitute medical emergencies and as such, they belong in a hospital's emergency department. When having these levels (as described below), do not come to our office. Our facility is not equipped to manage these levels. Go directly to an urgent care facility or an emergency department to be evaluated.  Definitions:  Activities of Daily Living (ADL): Activities of daily living (ADL or ADLs) is a term used in healthcare to refer to people's daily self-care activities. Health professionals often use a person's ability or inability to perform ADLs as a measurement of their functional status, particularly in regard to people post injury, with disabilities and the elderly. There are two ADL levels: Basic and Instrumental. Basic Activities of Daily Living (BADL  or BADLs) consist of self-care tasks that include: Bathing and showering;  personal hygiene and grooming (including brushing/combing/styling hair); dressing; Toilet hygiene (getting to the toilet, cleaning oneself, and getting back up); eating and self-feeding (not including cooking or chewing and swallowing); functional mobility, often referred to as "transferring", as measured by the ability to walk, get in and out of bed, and get into and out of a chair; the broader definition (moving from one  place to another while performing activities) is useful for people with different physical abilities who are still able to get around independently. Basic ADLs include the things many people do when they get up in the morning and get ready to go out of the house: get out of bed, go to the toilet, bathe, dress, groom, and eat. On the average, loss of function typically follows a particular order. Hygiene is the first to go, followed by loss of toilet use and locomotion. The last to go is the ability to eat. When there is only one remaining area in which the person is independent, there is a 62.9% chance that it is eating and only a 3.5% chance that it is hygiene. Instrumental Activities of Daily Living (IADL or IADLs) are not necessary for fundamental functioning, but they let an individual live independently in a community. IADL consist of tasks that include: cleaning and maintaining the house; home establishment and maintenance; care of others (including selecting and supervising caregivers); care of pets; child rearing; managing money; managing financials (investments, etc.); meal preparation and cleanup; shopping for groceries and necessities; moving within the community; safety procedures and emergency responses; health management and maintenance (taking prescribed medications); and using the telephone or other form of communication.  Instructions:  Most patients tend to report their pain as a combination of two factors, their physical pain and their psychosocial pain. This last one is  also known as "suffering" and it is reflection of how physical pain affects you socially and psychologically. From now on, report them separately.  From this point on, when asked to report your pain level, report only your physical pain. Use the following table for reference.  Pain Clinic Pain Levels (0-5/10)  Pain Level Score  Description  No Pain 0   Mild pain 1 Nagging, annoying, but does not interfere with basic activities of daily living (ADL). Patients are able to eat, bathe, get dressed, toileting (being able to get on and off the toilet and perform personal hygiene functions), transfer (move in and out of bed or a chair without assistance), and maintain continence (able to control bladder and bowel functions). Blood pressure and heart rate are unaffected. A normal heart rate for a healthy adult ranges from 60 to 100 bpm (beats per minute).   Mild to moderate pain 2 Noticeable and distracting. Impossible to hide from other people. More frequent flare-ups. Still possible to adapt and function close to normal. It can be very annoying and may have occasional stronger flare-ups. With discipline, patients may get used to it and adapt.   Moderate pain 3 Interferes significantly with activities of daily living (ADL). It becomes difficult to feed, bathe, get dressed, get on and off the toilet or to perform personal hygiene functions. Difficult to get in and out of bed or a chair without assistance. Very distracting. With effort, it can be ignored when deeply involved in activities.   Moderately severe pain 4 Impossible to ignore for more than a few minutes. With effort, patients may still be able to manage work or participate in some social activities. Very difficult to concentrate. Signs of autonomic nervous system discharge are evident: dilated pupils (mydriasis); mild sweating (diaphoresis); sleep interference. Heart rate becomes elevated (>115 bpm). Diastolic blood pressure (lower number) rises above  100 mmHg. Patients find relief in laying down and not moving.   Severe pain 5 Intense and extremely unpleasant. Associated with frowning face and frequent crying. Pain overwhelms the senses.  Ability to  do any activity or maintain social relationships becomes significantly limited. Conversation becomes difficult. Pacing back and forth is common, as getting into a comfortable position is nearly impossible. Pain wakes you up from deep sleep. Physical signs will be obvious: pupillary dilation; increased sweating; goosebumps; brisk reflexes; cold, clammy hands and feet; nausea, vomiting or dry heaves; loss of appetite; significant sleep disturbance with inability to fall asleep or to remain asleep. When persistent, significant weight loss is observed due to the complete loss of appetite and sleep deprivation.  Blood pressure and heart rate becomes significantly elevated. Caution: If elevated blood pressure triggers a pounding headache associated with blurred vision, then the patient should immediately seek attention at an urgent or emergency care unit, as these may be signs of an impending stroke.    Emergency Department Pain Levels (6-10/10)  Emergency Room Pain 6 Severely limiting. Requires emergency care and should not be seen or managed at an outpatient pain management facility. Communication becomes difficult and requires great effort. Assistance to reach the emergency department may be required. Facial flushing and profuse sweating along with potentially dangerous increases in heart rate and blood pressure will be evident.   Distressing pain 7 Self-care is very difficult. Assistance is required to transport, or use restroom. Assistance to reach the emergency department will be required. Tasks requiring coordination, such as bathing and getting dressed become very difficult.   Disabling pain 8 Self-care is no longer possible. At this level, pain is disabling. The individual is unable to do even the most  "basic" activities such as walking, eating, bathing, dressing, transferring to a bed, or toileting. Fine motor skills are lost. It is difficult to think clearly.   Incapacitating pain 9 Pain becomes incapacitating. Thought processing is no longer possible. Difficult to remember your own name. Control of movement and coordination are lost.   The worst pain imaginable 10 At this level, most patients pass out from pain. When this level is reached, collapse of the autonomic nervous system occurs, leading to a sudden drop in blood pressure and heart rate. This in turn results in a temporary and dramatic drop in blood flow to the brain, leading to a loss of consciousness. Fainting is one of the body's self defense mechanisms. Passing out puts the brain in a calmed state and causes it to shut down for a while, in order to begin the healing process.    Summary: 1.   Refer to this scale when providing [place] with your pain level. 2.   Be accurate and careful when reporting your pain level. This will help with your care. 3.   Over-reporting your pain level will lead to loss of credibility. 4.   Even a level of 1/10 means that there is pain and will be treated at our facility. 5.   High, inaccurate reporting will be documented as "Symptom Exaggeration", leading to loss of credibility and suspicions of possible secondary gains such as obtaining more narcotics, or wanting to appear disabled, for fraudulent reasons. 6.   Only pain levels of 5 or below will be seen at our facility. 7.   Pain levels of 6 and above will be sent to the Emergency Department and the appointment cancelled.  ______________________________________________________________________________________________

## 2018-04-15 NOTE — Patient Instructions (Addendum)

## 2018-04-15 NOTE — Progress Notes (Signed)
Safety precautions to be maintained throughout the outpatient stay will include: orient to surroundings, keep bed in low position, maintain call bell within reach at all times, provide assistance with transfer out of bed and ambulation.  

## 2018-04-16 NOTE — Progress Notes (Signed)
Results were reviewed and found to be: mildly abnormal  No acute injury or pathology identified  Review would suggest interventional pain management techniques may be of benefit 

## 2018-04-19 LAB — COMP. METABOLIC PANEL (12)
AST: 35 IU/L (ref 0–40)
Albumin/Globulin Ratio: 1.8 (ref 1.2–2.2)
Albumin: 4.7 g/dL (ref 3.8–4.9)
Alkaline Phosphatase: 125 IU/L — ABNORMAL HIGH (ref 39–117)
BUN/Creatinine Ratio: 7 — ABNORMAL LOW (ref 9–23)
BUN: 6 mg/dL (ref 6–24)
Bilirubin Total: 0.2 mg/dL (ref 0.0–1.2)
CALCIUM: 9.4 mg/dL (ref 8.7–10.2)
Chloride: 102 mmol/L (ref 96–106)
Creatinine, Ser: 0.91 mg/dL (ref 0.57–1.00)
GFR calc non Af Amer: 72 mL/min/{1.73_m2} (ref >59)
GFR, EST AFRICAN AMERICAN: 83 mL/min/{1.73_m2} (ref >59)
Globulin, Total: 2.6 g/dL (ref 1.5–4.5)
Glucose: 90 mg/dL (ref 65–99)
POTASSIUM: 3.6 mmol/L (ref 3.5–5.2)
Sodium: 141 mmol/L (ref 134–144)
Total Protein: 7.3 g/dL (ref 6.0–8.5)

## 2018-04-19 LAB — MAGNESIUM: Magnesium: 2.2 mg/dL (ref 1.6–2.3)

## 2018-04-19 LAB — 25-HYDROXY VITAMIN D LCMS D2+D3
25-Hydroxy, Vitamin D-2: <1 ng/mL
25-Hydroxy, Vitamin D-3: 33 ng/mL
25-Hydroxy, Vitamin D: 33 ng/mL

## 2018-04-19 LAB — VITAMIN B12: Vitamin B-12: 839 pg/mL (ref 232–1245)

## 2018-04-19 LAB — C-REACTIVE PROTEIN: CRP: 3 mg/L (ref 0–10)

## 2018-04-19 LAB — SEDIMENTATION RATE: Sed Rate: 36 mm/hr (ref 0–40)

## 2018-04-20 LAB — COMPLIANCE DRUG ANALYSIS, UR

## 2018-05-10 NOTE — Progress Notes (Signed)
Patient's Name: Linda Gomez  MRN: [id number]  Referring Provider: Trinna Post, PA-C  DOB: 10-03-1965  PCP: Trinna Post, PA-C  DOS: 05/11/2018  Note by: Gaspar Cola, MD  Service setting: Virtual Visit (Telephone)  Attending: Gaspar Cola, MD  Location: Telephone Encounter  Specialty: Interventional Pain Management  Patient type: Established   Pain Management Virtual Encounter Note - Virtual Visit via Telephone Telehealth (real-time audio visits between healthcare provider and patient).  Patient's Phone No.:  [phone] (home); [phone] (mobile); (Preferred) [phone]  Pre-screening note:  Our staff contacted Ms. Galano and offered her an "in person", "face-to-face" appointment versus a telephone encounter. She indicated preferring the telephone encounter, at this time.   Primary Reason(s) for Virtual Visit: Encounter for evaluation before starting new chronic pain management plan of care (Level of risk: moderate) COVID-19*  Social distancing based on CDC ans AMA recommendations.    I contacted Lesia Sago on 05/11/2018 at 12:48 PM by telephone and clearly identified myself as Gaspar Cola, MD. I verified that I was speaking with the correct person using two identifiers (Name and date of birth: January 21, 1966).  Advanced Informed Consent I sought verbal advanced consent from Lesia Sago for telemedicine interactions and virtual visit. I informed Ms. Edgington of the security and privacy concerns, risks, and limitations associated with performing an evaluation and management service by telephone. I also informed Ms. Renbarger of the availability of "in person" appointments and I informed her of the possibility of a patient responsible charge related to this service. Ms. Kluck expressed understanding and agreed to proceed.   Historic Elements   Ms. Courtenay Hirth is a 53 y.o. year old, female patient evaluated today after her last encounter by our  practice on 04/15/2018. Ms. Glennie  has a past medical history of Allergy, Anxiety, Arthritis, Bilateral myofascial pain, Depression, Fibromyalgia, GERD (gastroesophageal reflux disease), Hyperlipidemia, Hypertension, Neuromuscular disorder ([place]), Restless leg syndrome, Trigeminal neuralgia, Trigeminal neuralgia, Trigeminal neuralgia, and Trigeminal neuropathy. She also  has a past surgical history that includes Brain surgery and Cesarean section. Ms. Stallworth has a current medication list which includes the following prescription(s): cholecalciferol, dihydroergotamine, fremanezumab-vfrm, pravastatin, pregabalin, tapentadol, and tapentadol hcl. She  reports that she has never smoked. She has never used smokeless tobacco. She reports current alcohol use of about 1.0 standard drinks of alcohol per week. She reports that she does not use drugs. Ms. Galdamez has No Known Allergies.   HPI  She is being evaluated for review of studies ordered on initial visit and to consider treatment plan options. Today I went over the results of her tests. These were explained in "Layman's terms". During today's appointment I went over my diagnostic impression, as well as the proposed treatment plan.  According to the patient her primary area of pain is on the left side of her face.  She has pain that radiates down into the side of her face and into her jaw. He has numbness, tingling and burning pain that is nagging. She admits that she has a diagnosis of trigeminal neuralgia. She is status post retromastoid craniectomy microvascular decompression June 2018. She is also had trigeminal nerve blocks by Dr. August Albino ([facility] in [place]).  She does not feel any real difference.  She has been seen by a headache specialist and a history of pain specialist, all within the [facility] system. Currently on Nucynta and gabapentin. She feels like the gabapentin may be causing some memory changes so she is  tapering down on this. She admits  that she is also involved in a study and has had a recent MRI at [facility].  Her second area of pain is in her neck. She has a history of headaches but does not feel like this is related. She admits that the headaches are well managed. She feels like the pain goes down both sides. She has any arm pain. She denies any previous surgery. She has had trigger point injections.  In considering the treatment plan options, Ms. Renier was reminded that I no longer take patients for medication management only. I asked her to let me know if she had no intention of taking advantage of the interventional therapies, so that we could make arrangements to provide this space to someone interested. I also made it clear that undergoing interventional therapies for the purpose of getting pain medications is very inappropriate on the part of a patient, and it will not be tolerated in this practice. This type of behavior would suggest true addiction and therefore it requires referral to an addiction specialist.   I discussed the assessment and treatment plan with the patient. The patient was provided an opportunity to ask questions and all were answered. The patient agreed with the plan and demonstrated an understanding of the instructions.  Patient advised to call back or seek an in-person evaluation if the symptoms or condition worsens.  Controlled Substance Pharmacotherapy Assessment REMS (Risk Evaluation and Mitigation Strategy)  Analgesic: Nucynta 75 mg 1 tablet 4 times daily (fill date 02/26/2018) Nucynta 300 mg/day Highest recorded MME/day: 120 mg/day MME/day: 120 mg/day   Monitoring: [place] PMP: PDMP reviewed during this encounter.       Not applicable at this point since we have not taken over the patient's medication management yet. List of other Serum/Urine Drug Screening Test(s):  No results found. List of all UDS test(s) done:  Lab Results  Component Value Date   SUMMARY FINAL 04/15/2018   Last UDS on  record: Summary  Date Value Ref Range Status  04/15/2018 FINAL  Final    Comment:    ==================================================================== TOXASSURE COMP DRUG ANALYSIS,UR ==================================================================== Test                             Result       Flag       Units Drug Present   Tapentadol                     >4651                   ng/mg creat    Source of tapentadol is a scheduled prescription medication.   Gabapentin                     PRESENT   Oxcarbazepine MHD              PRESENT    Oxcarbazepine MHD is the active metabolite of oxcarbazepine and    eslicarbazepine.   Topiramate                     PRESENT   Acetaminophen                  PRESENT   Diphenhydramine                PRESENT ==================================================================== Test  Result    Flag   Units      Ref Range   Creatinine              215              mg/dL      >=20 ==================================================================== Declared Medications:  Medication list was not provided. ==================================================================== For clinical consultation, please call [phone]. ====================================================================    UDS interpretation: No unexpected findings.          Medication Assessment Form: Patient introduced to form today Treatment compliance: Treatment may start today if patient agrees with proposed plan. Evaluation of compliance is not applicable at this point Risk Assessment Profile: Aberrant behavior: See initial evaluations. None observed or detected today Comorbid factors increasing risk of overdose: See initial evaluation. No additional risks detected today Opioid risk tool (ORT):  Opioid Risk  04/15/2018  Alcohol 1  Illegal Drugs 0  Rx Drugs 0  Alcohol 0  Illegal Drugs 0  Rx Drugs 0  Age between 16-45 years  0  Psychological  Disease 0  Depression 0  Opioid Risk Tool Scoring 1  Opioid Risk Interpretation Low Risk    ORT Scoring interpretation table:  Score <3 = Low Risk for SUD  Score between 4-7 = Moderate Risk for SUD  Score >8 = High Risk for Opioid Abuse   Risk of substance use disorder (SUD): Low  Risk Mitigation Strategies:  Patient opioid safety counseling: Completed today. Counseling provided to patient as per "Patient Counseling Document". Document signed by patient, attesting to counseling and understanding Patient-Prescriber Agreement (PPA): Obtained today.  Controlled substance notification to other providers: Written and sent today.  Pharmacologic Plan: Today we may be taking over the patient's pharmacological regimen. See below.             Meds   Current Outpatient Medications:  .  Cholecalciferol (D 1000) 25 MCG (1000 UT) CHEW, Chew 1,000 Units by mouth daily., Disp: , Rfl:  .  dihydroergotamine (MIGRANAL) 4 MG/ML nasal spray, Place 1 spray into the nose as needed., Disp: , Rfl:  .  Fremanezumab-vfrm 225 MG/1.5ML SOSY, Inject 225 mg into the skin every 28 (twenty-eight) days., Disp: , Rfl:  .  pravastatin (PRAVACHOL) 80 MG tablet, Take 1 tablet (80 mg total) by mouth daily., Disp: 90 tablet, Rfl: 1 .  pregabalin (LYRICA) 25 MG capsule, Take 1 capsule (25 mg total) by mouth 3 (three) times daily., Disp: 90 capsule, Rfl: 2 .  tapentadol (NUCYNTA) 50 MG tablet, Take 1 tablet (50 mg total) by mouth every 6 (six) hours for 7 days, THEN 1 tablet (50 mg total) every 8 (eight) hours for 7 days, THEN 1 tablet (50 mg total) 2 (two) times daily for 7 days, THEN 1 tablet (50 mg total) daily for 7 days., Disp: 70 tablet, Rfl: 0 .  tapentadol HCl (NUCYNTA) 75 MG tablet, Take 75 mg by mouth 4 (four) times daily., Disp: , Rfl:   Laboratory Chemistry  Inflammation Markers (CRP: Acute Phase) (ESR: Chronic Phase) Lab Results  Component Value Date   CRP 3 04/15/2018   ESRSEDRATE 36 04/15/2018                          Rheumatology Markers No results found.  Renal Function Markers Lab Results  Component Value Date   BUN 6 04/15/2018   CREATININE 0.91 04/15/2018   BCR 7 (L) 04/15/2018   GFRAA 83 04/15/2018  GFRNONAA 72 04/15/2018                             Hepatic Function Markers Lab Results  Component Value Date   AST 35 04/15/2018   ALT 25 01/06/2018   ALBUMIN 4.7 04/15/2018   ALKPHOS 125 (H) 04/15/2018                        Electrolytes Lab Results  Component Value Date   NA 141 04/15/2018   K 3.6 04/15/2018   CL 102 04/15/2018   CALCIUM 9.4 04/15/2018   MG 2.2 04/15/2018                        Neuropathy Markers Lab Results  Component Value Date   WPVXYIAX65 537 04/15/2018                        CNS Tests No results found.  Bone Pathology Markers Lab Results  Component Value Date   25OHVITD1 33 04/15/2018   25OHVITD2 <1.0 04/15/2018   25OHVITD3 33 04/15/2018                         Coagulation Parameters Lab Results  Component Value Date   PLT 267 01/06/2018                        Cardiovascular Markers Lab Results  Component Value Date   HGB 11.7 01/06/2018   HCT 35.0 01/06/2018                         ID Markers No results found.  CA Markers No results found.  Endocrine Markers Lab Results  Component Value Date   TSH 1.420 01/06/2018                        Note: Lab results reviewed.  Recent Diagnostic Imaging Review  Cervical Imaging: Cervical DG Bending/F/E views:  Results for orders placed during the hospital encounter of 04/15/18  DG Cervical Spine With Flex & Extend   Narrative CLINICAL DATA:  Neck pain radiating into both shoulders.  EXAM: CERVICAL SPINE COMPLETE WITH FLEXION AND EXTENSION VIEWS  COMPARISON:  None.  FINDINGS: Mild degenerative disc disease with mild disc space narrowing and osteophyte formation at C5-6 and C6-7. Right oblique view shows no evidence of bony foraminal stenosis. The left oblique  view is not as well positioned to evaluate the foramina.  No listhesis in the neutral position. There is fair motion with flexion and extension without evidence of instability. No fracture or bony lesion identified. No prevertebral soft tissue swelling.  IMPRESSION: Mild degenerative disc disease at C5-6 and C6-7. No listhesis or evidence of instability with flexion or extension.   Electronically Signed   By: Aletta Edouard M.D.   On: 04/15/2018 16:52    Complexity Note: Imaging results reviewed. Results shared with Ms. Dunn, using Layman's terms.                         Assessment  The primary encounter diagnosis was Chronic pain syndrome. Diagnoses of Chronic facial pain (Primary Area of Pain) (left), Chronic neck pain (Secondary Area of Pain) (Bilateral) (L>R), DDD (degenerative disc disease), cervical, Fibromyalgia, Myofascial  pain syndrome, Restless leg syndrome, Trigeminal neuralgia (Left), and Trigeminal neuropathy (Left) were also pertinent to this visit.  Plan of Care  I have discontinued Hassan Rowan R. Valiente's gabapentin. I am also having her start on tapentadol. Additionally, I am having her maintain her Cholecalciferol, Fremanezumab-vfrm, tapentadol HCl, pravastatin, dihydroergotamine, and pregabalin. Pharmacotherapy (Medications Ordered): Meds ordered this encounter  Medications  . pregabalin (LYRICA) 25 MG capsule    Sig: Take 1 capsule (25 mg total) by mouth 3 (three) times daily.    Dispense:  90 capsule    Refill:  2    Do not place this medication, or any other prescription from our practice, on "Automatic Refill". Patient may have prescription filled one day early if pharmacy is closed on scheduled refill date.  . tapentadol (NUCYNTA) 50 MG tablet    Sig: Take 1 tablet (50 mg total) by mouth every 6 (six) hours for 7 days, THEN 1 tablet (50 mg total) every 8 (eight) hours for 7 days, THEN 1 tablet (50 mg total) 2 (two) times daily for 7 days, THEN 1 tablet (50 mg  total) daily for 7 days.    Dispense:  70 tablet    Refill:  0    [place] STOP ACT - Not applicable to Chronic Pain Syndrome (G89.4) diagnosis. Fill one day early if pharmacy is closed on scheduled refill date. Do not fill until: 05/11/18. To last until: 06/07/18.   Procedure Orders    No procedure(s) ordered today   Lab Orders  No laboratory test(s) ordered today   Imaging Orders  No imaging studies ordered today   Referral Orders  No referral(s) requested today   Orders:  No orders of the defined types were placed in this encounter.  Pharmacological management options:  Opioid Analgesics: We'll take over management today. See above orders Membrane stabilizer: Options discussed, including a trial. Muscle relaxant: We have discussed the possibility of a trial NSAID: Trial discussed. Other analgesic(s): To be determined at a later time   Interventional management options: Planned, scheduled, and/or pending:    No interventional therapies at this time secondary to the COVID-19 restrictions.  Today we will go ahead and discontinue the Neurontin and start the patient on Lyrica 25 mg daily and will advance to 3 times daily as tolerated.  In addition, we will be tapering down and stopping the Nucynta for the purpose of a drug holiday.  The patient started with an MME of 120 mg/day.  She was taking Nucynta 75 mg every 6 hours.  We will be going to 50 mg every 6 hours but we will be decreasing this by 1 dose per day every 7 days.  Once the patient has completed the drug holiday, then we will reassess and determine whether or not to continue with the [place].   Considering:   Diagnostic left trigeminal nerve block  Diagnostic left greater occipital nerve block  Diagnostic trigger point injections  Diagnostic bilateral cervical facet nerve blocks   Possible bilateral cervical facet RFA    PRN Procedures:   None at this time   Total duration of non-face-to-face encounter: 45  minutes.  Follow-up plan:   Return in about 1 month (around 06/10/2018).    No future appointments.  Primary Care Physician: Paulene Floor Location: Telephone Virtual Visit Note by: Gaspar Cola, MD Date: 05/11/2018; Time: 12:48 PM  Disclaimer:  * Given the special circumstances of the COVID-19 pandemic, the federal government has announced that the Office for  Civil Rights (OCR) will exercise its enforcement discretion and will not impose penalties on physicians using telehealth in the event of noncompliance with regulatory requirements under the [place] (HIPAA) in connection with the good faith provision of telehealth during the ZVJKQ-20 national public health emergency. ([place])

## 2018-05-11 ENCOUNTER — Ambulatory Visit: Attending: Pain Medicine | Admitting: Pain Medicine

## 2018-05-11 ENCOUNTER — Other Ambulatory Visit: Payer: Self-pay

## 2018-05-11 DIAGNOSIS — R519 Headache, unspecified: Secondary | ICD-10-CM

## 2018-05-11 DIAGNOSIS — G4486 Cervicogenic headache: Secondary | ICD-10-CM | POA: Insufficient documentation

## 2018-05-11 DIAGNOSIS — G8929 Other chronic pain: Secondary | ICD-10-CM

## 2018-05-11 DIAGNOSIS — R51 Headache: Secondary | ICD-10-CM

## 2018-05-11 DIAGNOSIS — M7918 Myalgia, other site: Secondary | ICD-10-CM

## 2018-05-11 DIAGNOSIS — M542 Cervicalgia: Secondary | ICD-10-CM | POA: Diagnosis not present

## 2018-05-11 DIAGNOSIS — M797 Fibromyalgia: Secondary | ICD-10-CM

## 2018-05-11 DIAGNOSIS — G894 Chronic pain syndrome: Secondary | ICD-10-CM | POA: Diagnosis not present

## 2018-05-11 DIAGNOSIS — M5030 Other cervical disc degeneration, unspecified cervical region: Secondary | ICD-10-CM | POA: Diagnosis not present

## 2018-05-11 DIAGNOSIS — G509 Disorder of trigeminal nerve, unspecified: Secondary | ICD-10-CM

## 2018-05-11 DIAGNOSIS — G500 Trigeminal neuralgia: Secondary | ICD-10-CM

## 2018-05-11 DIAGNOSIS — G2581 Restless legs syndrome: Secondary | ICD-10-CM

## 2018-05-11 MED ORDER — PREGABALIN 25 MG PO CAPS: 25.0000 mg | ORAL_CAPSULE | Freq: Three times a day (TID) | ORAL | 2 refills | Status: DC

## 2018-05-11 MED ORDER — TAPENTADOL HCL 50 MG PO TABS: ORAL_TABLET | ORAL | 0 refills | Status: DC

## 2018-05-11 NOTE — Patient Instructions (Signed)
______________________________________________________________________________________________  Specialty Pain Scale  Introduction:  There are significant differences in how pain is reported. The word pain usually refers to physical pain, but it is also a common synonym of suffering. The medical community uses a scale from 0 (zero) to 10 (ten) to report pain level. Zero (0) is described as "no pain", while ten (10) is described as "the worse pain you can imagine". The problem with this scale is that physical pain is reported along with suffering. Suffering refers to mental pain, or more often yet it refers to any unpleasant feeling, emotion or aversion associated with the perception of harm or threat of harm. It is the psychological component of pain.  Pain Specialists prefer to separate the two components. The pain scale used by this practice is the Verbal Numerical Rating Scale (VNRS-11). This scale is for the physical pain only. DO NOT INCLUDE how your pain psychologically affects you. This scale is for adults 21 years of age and older. It has 11 (eleven) levels. The 1st level is 0/10. This means: "right now, I have no pain". In the context of pain management, it also means: "right now, my physical pain is under control with the current therapy".  General Information:  The scale should reflect your current level of pain. Unless you are specifically asked for the level of your worst pain, or your average pain. If you are asked for one of these two, then it should be understood that it is over the past 24 hours.  Levels 1 (one) through 5 (five) are described below, and can be treated as an outpatient. Ambulatory pain management facilities such as ours are more than adequate to treat these levels. Levels 6 (six) through 10 (ten) are also described below, however, these must be treated as a hospitalized patient. While levels 6 (six) and 7 (seven) may be evaluated at an urgent care facility, levels 8  (eight) through 10 (ten) constitute medical emergencies and as such, they belong in a hospital's emergency department. When having these levels (as described below), do not come to our office. Our facility is not equipped to manage these levels. Go directly to an urgent care facility or an emergency department to be evaluated.  Definitions:  Activities of Daily Living (ADL): Activities of daily living (ADL or ADLs) is a term used in healthcare to refer to people's daily self-care activities. Health professionals often use a person's ability or inability to perform ADLs as a measurement of their functional status, particularly in regard to people post injury, with disabilities and the elderly. There are two ADL levels: Basic and Instrumental. Basic Activities of Daily Living (BADL  or BADLs) consist of self-care tasks that include: Bathing and showering; personal hygiene and grooming (including brushing/combing/styling hair); dressing; Toilet hygiene (getting to the toilet, cleaning oneself, and getting back up); eating and self-feeding (not including cooking or chewing and swallowing); functional mobility, often referred to as "transferring", as measured by the ability to walk, get in and out of bed, and get into and out of a chair; the broader definition (moving from one place to another while performing activities) is useful for people with different physical abilities who are still able to get around independently. Basic ADLs include the things many people do when they get up in the morning and get ready to go out of the house: get out of bed, go to the toilet, bathe, dress, groom, and eat. On the average, loss of function typically follows a particular order.   Hygiene is the first to go, followed by loss of toilet use and locomotion. The last to go is the ability to eat. When there is only one remaining area in which the person is independent, there is a 62.9% chance that it is eating and only a 3.5% chance  that it is hygiene. Instrumental Activities of Daily Living (IADL or IADLs) are not necessary for fundamental functioning, but they let an individual live independently in a community. IADL consist of tasks that include: cleaning and maintaining the house; home establishment and maintenance; care of others (including selecting and supervising caregivers); care of pets; child rearing; managing money; managing financials (investments, etc.); meal preparation and cleanup; shopping for groceries and necessities; moving within the community; safety procedures and emergency responses; health management and maintenance (taking prescribed medications); and using the telephone or other form of communication.  Instructions:  Most patients tend to report their pain as a combination of two factors, their physical pain and their psychosocial pain. This last one is also known as "suffering" and it is reflection of how physical pain affects you socially and psychologically. From now on, report them separately.  From this point on, when asked to report your pain level, report only your physical pain. Use the following table for reference.  Pain Clinic Pain Levels (0-5/10)  Pain Level Score  Description  No Pain 0   Mild pain 1 Nagging, annoying, but does not interfere with basic activities of daily living (ADL). Patients are able to eat, bathe, get dressed, toileting (being able to get on and off the toilet and perform personal hygiene functions), transfer (move in and out of bed or a chair without assistance), and maintain continence (able to control bladder and bowel functions). Blood pressure and heart rate are unaffected. A normal heart rate for a healthy adult ranges from 60 to 100 bpm (beats per minute).   Mild to moderate pain 2 Noticeable and distracting. Impossible to hide from other people. More frequent flare-ups. Still possible to adapt and function close to normal. It can be very annoying and may have  occasional stronger flare-ups. With discipline, patients may get used to it and adapt.   Moderate pain 3 Interferes significantly with activities of daily living (ADL). It becomes difficult to feed, bathe, get dressed, get on and off the toilet or to perform personal hygiene functions. Difficult to get in and out of bed or a chair without assistance. Very distracting. With effort, it can be ignored when deeply involved in activities.   Moderately severe pain 4 Impossible to ignore for more than a few minutes. With effort, patients may still be able to manage work or participate in some social activities. Very difficult to concentrate. Signs of autonomic nervous system discharge are evident: dilated pupils (mydriasis); mild sweating (diaphoresis); sleep interference. Heart rate becomes elevated (>115 bpm). Diastolic blood pressure (lower number) rises above 100 mmHg. Patients find relief in laying down and not moving.   Severe pain 5 Intense and extremely unpleasant. Associated with frowning face and frequent crying. Pain overwhelms the senses.  Ability to do any activity or maintain social relationships becomes significantly limited. Conversation becomes difficult. Pacing back and forth is common, as getting into a comfortable position is nearly impossible. Pain wakes you up from deep sleep. Physical signs will be obvious: pupillary dilation; increased sweating; goosebumps; brisk reflexes; cold, clammy hands and feet; nausea, vomiting or dry heaves; loss of appetite; significant sleep disturbance with inability to fall asleep or to   remain asleep. When persistent, significant weight loss is observed due to the complete loss of appetite and sleep deprivation.  Blood pressure and heart rate becomes significantly elevated. Caution: If elevated blood pressure triggers a pounding headache associated with blurred vision, then the patient should immediately seek attention at an urgent or emergency care unit, as  these may be signs of an impending stroke.    Emergency Department Pain Levels (6-10/10)  Emergency Room Pain 6 Severely limiting. Requires emergency care and should not be seen or managed at an outpatient pain management facility. Communication becomes difficult and requires great effort. Assistance to reach the emergency department may be required. Facial flushing and profuse sweating along with potentially dangerous increases in heart rate and blood pressure will be evident.   Distressing pain 7 Self-care is very difficult. Assistance is required to transport, or use restroom. Assistance to reach the emergency department will be required. Tasks requiring coordination, such as bathing and getting dressed become very difficult.   Disabling pain 8 Self-care is no longer possible. At this level, pain is disabling. The individual is unable to do even the most "basic" activities such as walking, eating, bathing, dressing, transferring to a bed, or toileting. Fine motor skills are lost. It is difficult to think clearly.   Incapacitating pain 9 Pain becomes incapacitating. Thought processing is no longer possible. Difficult to remember your own name. Control of movement and coordination are lost.   The worst pain imaginable 10 At this level, most patients pass out from pain. When this level is reached, collapse of the autonomic nervous system occurs, leading to a sudden drop in blood pressure and heart rate. This in turn results in a temporary and dramatic drop in blood flow to the brain, leading to a loss of consciousness. Fainting is one of the body's self defense mechanisms. Passing out puts the brain in a calmed state and causes it to shut down for a while, in order to begin the healing process.    Summary: 1.   Refer to this scale when providing us with your pain level. 2.   Be accurate and careful when reporting your pain level. This will help with your care. 3.   Over-reporting your pain level  will lead to loss of credibility. 4.   Even a level of 1/10 means that there is pain and will be treated at our facility. 5.   High, inaccurate reporting will be documented as "Symptom Exaggeration", leading to loss of credibility and suspicions of possible secondary gains such as obtaining more narcotics, or wanting to appear disabled, for fraudulent reasons. 6.   Only pain levels of 5 or below will be seen at our facility. 7.   Pain levels of 6 and above will be sent to the Emergency Department and the appointment cancelled.  ______________________________________________________________________________________________    ____________________________________________________________________________________________  Medication Rules  Purpose: To inform patients, and their family members, of our rules and regulations.  Applies to: All patients receiving prescriptions (written or electronic).  Pharmacy of record: Pharmacy where electronic prescriptions will be sent. If written prescriptions are taken to a different pharmacy, please inform the nursing staff. The pharmacy listed in the electronic medical record should be the one where you would like electronic prescriptions to be sent.  Electronic prescriptions: In compliance with the [place] [employer]reng[employer] Opioid Misuse Prevention (STOP) Act of 2017 (Session Law 2017-74/H243), effective January 28, 2018, all controlled substances must be electronically prescribed. Calling prescriptions to the pharmacy will cease   to exist.  Prescription refills: Only during scheduled appointments. Applies to all prescriptions.  NOTE: The following applies primarily to controlled substances (Opioid* Pain Medications).   Patient's responsibilities: 1. Pain Pills: Bring all pain pills to every appointment (except for procedure appointments). 2. Pill Bottles: Bring pills in original pharmacy bottle. Always bring the newest bottle. Bring bottle, even if  empty. 3. Medication refills: You are responsible for knowing and keeping track of what medications you take and those you need refilled. The day before your appointment: write a list of all prescriptions that need to be refilled. The day of the appointment: give the list to the admitting nurse. Prescriptions will be written only during appointments. No prescriptions will be written on procedure days. If you forget a medication: it will not be "Called in", "Faxed", or "electronically sent". You will need to get another appointment to get these prescribed. No early refills. Do not call asking to have your prescription filled early. 4. Prescription Accuracy: You are responsible for carefully inspecting your prescriptions before leaving our office. Have the discharge nurse carefully go over each prescription with you, before taking them home. Make sure that your name is accurately spelled, that your address is correct. Check the name and dose of your medication to make sure it is accurate. Check the number of pills, and the written instructions to make sure they are clear and accurate. Make sure that you are given enough medication to last until your next medication refill appointment. 5. Taking Medication: Take medication as prescribed. When it comes to controlled substances, taking less pills or less frequently than prescribed is permitted and encouraged. Never take more pills than instructed. Never take medication more frequently than prescribed.  6. Inform other Doctors: Always inform, all of your healthcare providers, of all the medications you take. 7. Pain Medication from other Providers: You are not allowed to accept any additional pain medication from any other Doctor or Healthcare provider. There are two exceptions to this rule. (see below) In the event that you require additional pain medication, you are responsible for notifying us, as stated below. 8. Medication Agreement: You are responsible  for carefully reading and following our Medication Agreement. This must be signed before receiving any prescriptions from our practice. Safely store a copy of your signed Agreement. Violations to the Agreement will result in no further prescriptions. (Additional copies of our Medication Agreement are available upon request.) 9. Laws, Rules, & Regulations: All patients are expected to follow all Federal and State Laws, Statutes, Rules, & Regulations. Ignorance of the Laws does not constitute a valid excuse. The use of any illegal substances is prohibited. 10. Adopted CDC guidelines & recommendations: Target dosing levels will be at or below 60 MME/day. Use of benzodiazepines** is not recommended.  Exceptions: There are only two exceptions to the rule of not receiving pain medications from other Healthcare Providers. 1. Exception #1 (Emergencies): In the event of an emergency (i.e.: accident requiring emergency care), you are allowed to receive additional pain medication. However, you are responsible for: As soon as you are able, call our office [phone], at any time of the day or night, and leave a message stating your name, the date and nature of the emergency, and the name and dose of the medication prescribed. In the event that your call is answered by a member of our staff, make sure to document and save the date, time, and the name of the person that took your information.  2.   Exception #2 (Planned Surgery): In the event that you are scheduled by another doctor or dentist to have any type of surgery or procedure, you are allowed (for a period no longer than 30 days), to receive additional pain medication, for the acute post-op pain. However, in this case, you are responsible for picking up a copy of our "Post-op Pain Management for Surgeons" handout, and giving it to your surgeon or dentist. This document is available at our office, and does not require an appointment to obtain it. Simply go to our  office during business hours (Monday-Thursday from 8:00 AM to 4:00 PM) (Friday 8:00 AM to 12:00 Noon) or if you have a scheduled appointment with us, prior to your surgery, and ask for it by name. In addition, you will need to provide us with your name, name of your surgeon, type of surgery, and date of procedure or surgery.  *Opioid medications include: morphine, codeine, oxycodone, oxymorphone, hydrocodone, hydromorphone, meperidine, tramadol, tapentadol, buprenorphine, fentanyl, methadone. **Benzodiazepine medications include: diazepam (Valium), alprazolam (Xanax), clonazepam (Klonopine), lorazepam (Ativan), clorazepate (Tranxene), chlordiazepoxide (Librium), estazolam (Prosom), oxazepam (Serax), temazepam (Restoril), triazolam (Halcion) (Last updated: 03/27/2017) ____________________________________________________________________________________________   ____________________________________________________________________________________________  Medication Recommendations and Reminders  Applies to: All patients receiving prescriptions (written and/or electronic).  Medication Rules & Regulations: These rules and regulations exist for your safety and that of others. They are not flexible and neither are we. Dismissing or ignoring them will be considered "non-compliance" with medication therapy, resulting in complete and irreversible termination of such therapy. (See document titled "Medication Rules" for more details.) In all conscience, because of safety reasons, we cannot continue providing a therapy where the patient does not follow instructions.  Pharmacy of record:   Definition: This is the pharmacy where your electronic prescriptions will be sent.   We do not endorse any particular pharmacy.  You are not restricted in your choice of pharmacy.  The pharmacy listed in the electronic medical record should be the one where you want electronic prescriptions to be sent.  If you choose  to change pharmacy, simply notify our nursing staff of your choice of new pharmacy.  Recommendations:  Keep all of your pain medications in a safe place, under lock and key, even if you live alone.   After you fill your prescription, take 1 week's worth of pills and put them away in a safe place. You should keep a separate, properly labeled bottle for this purpose. The remainder should be kept in the original bottle. Use this as your primary supply, until it runs out. Once it's gone, then you know that you have 1 week's worth of medicine, and it is time to come in for a prescription refill. If you do this correctly, it is unlikely that you will ever run out of medicine.  To make sure that the above recommendation works, it is very important that you make sure your medication refill appointments are scheduled at least 1 week before you run out of medicine. To do this in an effective manner, make sure that you do not leave the office without scheduling your next medication management appointment. Always ask the nursing staff to show you in your prescription , when your medication will be running out. Then arrange for the receptionist to get you a return appointment, at least 7 days before you run out of medicine. Do not wait until you have 1 or 2 pills left, to come in. This is very poor planning and does not take into consideration   that we may need to cancel appointments due to bad weather, sickness, or emergencies affecting our staff.  "Partial Fill": If for any reason your pharmacy does not have enough pills/tablets to completely fill or refill your prescription, do not allow for a "partial fill". You will need a separate prescription to fill the remaining amount, which we will not provide. If the reason for the partial fill is your insurance, you will need to talk to the pharmacist about payment alternatives for the remaining tablets, but again, do not accept a partial fill.  Prescription refills  and/or changes in medication(s):   Prescription refills, and/or changes in dose or medication, will be conducted only during scheduled medication management appointments. (Applies to both, written and electronic prescriptions.)  No refills on procedure days. No medication will be changed or started on procedure days. No changes, adjustments, and/or refills will be conducted on a procedure day. Doing so will interfere with the diagnostic portion of the procedure.  No phone refills. No medications will be "called into the pharmacy".  No Fax refills.  No weekend refills.  No Holliday refills.  No after hours refills.  Remember:  Business hours are:  Monday to Thursday 8:00 AM to 4:00 PM Provider's Schedule: Crystal King, NP - Appointments are:  Medication management: Monday to Thursday 8:00 AM to 4:00 PM Zyan Mirkin, MD - Appointments are:  Medication management: Monday and Wednesday 8:00 AM to 4:00 PM Procedure day: Tuesday and Thursday 7:30 AM to 4:00 PM Bilal Lateef, MD - Appointments are:  Medication management: Tuesday and Thursday 8:00 AM to 4:00 PM Procedure day: Monday and Wednesday 7:30 AM to 4:00 PM (Last update: 03/27/2017) ____________________________________________________________________________________________   ____________________________________________________________________________________________  CANNABIDIOL (AKA: CBD Oil or Pills)  Applies to: All patients receiving prescriptions of controlled substances (written and/or electronic).  General Information: Cannabidiol (CBD) was discovered in 1940. It is one of some 113 identified cannabinoids in cannabis (Marijuana) plants, accounting for up to 40% of the plant's extract. As of 2018, preliminary clinical research on cannabidiol included studies of anxiety, cognition, movement disorders, and pain.  Cannabidiol is consummed in multiple ways, including inhalation of cannabis smoke or vapor, as an aerosol  spray into the cheek, and by mouth. It may be supplied as CBD oil containing CBD as the active ingredient (no added tetrahydrocannabinol (THC) or terpenes), a full-plant CBD-dominant hemp extract oil, capsules, dried cannabis, or as a liquid solution. CBD is thought not have the same psychoactivity as THC, and may affect the actions of THC. Studies suggest that CBD may interact with different biological targets, including cannabinoid receptors and other neurotransmitter receptors. As of 2018 the mechanism of action for its biological effects has not been determined.  In the [place], cannabidiol has a limited approval by the Food and Drug Administration (FDA) for treatment of only two types of epilepsy disorders. The side effects of long-term use of the drug include somnolence, decreased appetite, diarrhea, fatigue, malaise, weakness, sleeping problems, and others.  CBD remains a Schedule I drug prohibited for any use.  Legality: Some manufacturers ship CBD products nationally, an illegal action which the FDA has not enforced in 2018, with CBD remaining the subject of an FDA investigational new drug evaluation, and is not considered legal as a dietary supplement or food ingredient as of December 2018. Federal illegality has made it difficult historically to conduct research on CBD. CBD is openly sold in head shops and health food stores in some states where such sales have   not been explicitly legalized.  Warning: Because it is not FDA approved for general use or treatment of pain, it is not required to undergo the same manufacturing controls as prescription drugs.  This means that the available cannabidiol (CBD) may be contaminated with THC.  If this is the case, it will trigger a positive urine drug screen (UDS) test for cannabinoids (Marijuana).  Because a positive UDS for illicit substances is a violation of our medication agreement, your opioid analgesics (pain medicine) may be permanently  discontinued. (Last update: 04/17/2017) ____________________________________________________________________________________________    

## 2018-06-02 ENCOUNTER — Telehealth: Payer: Self-pay

## 2018-06-02 ENCOUNTER — Encounter: Payer: Self-pay | Admitting: Pain Medicine

## 2018-06-02 NOTE — Telephone Encounter (Signed)
She left a vm stating ehe wants a nurse to call her back. She did not leave a reason.

## 2018-06-02 NOTE — Telephone Encounter (Signed)
Pain at top of head, forehead, back of head, behind right eye and face. Offered sooner appointment, will have telephone appointment 06-04-18.

## 2018-06-03 NOTE — Progress Notes (Signed)
Pain Management Virtual Encounter Note - Virtual Visit via Telephone Telehealth (real-time audio visits between healthcare provider and patient).  Patient's Phone No. & Preferred Pharmacy:  [phone] (home); [phone] (mobile); (Preferred) [phone] [email or web address]  [id number] - [place], [place] - [address] AT [facility] OF [address] [place] [address] Phone: [phone] Fax: [phone]   Pre-screening note:  Our staff contacted Linda Gomez and offered her an "in person", "face-to-face" appointment versus a telephone encounter. She indicated preferring the telephone encounter, at this time.  Reason for Virtual Visit: COVID-19*  Social distancing based on CDC and AMA recommendations.   I contacted Linda Gomez on 06/04/2018 at 8:50 AM via telephone.  I clearly identified myself as Oswaldo Done, MD. I verified that I was speaking with the correct person using two identifiers (Name and date of birth: [birth year]).  Advanced Informed Consent I sought verbal advanced consent from Linda Gomez for virtual visit interactions. I informed Linda Gomez of possible security and privacy concerns, risks, and limitations associated with providing "not-in-person" medical evaluation and management services. I also informed Linda Gomez of the availability of "in-person" appointments. Finally, I informed her that there would be a charge for the virtual visit and that she could be  personally, fully or partially, financially responsible for it. Ms. Sane expressed understanding and agreed to proceed.   Historic Elements   Linda Gomez is a 53 y.o. year old, female patient evaluated today after her last encounter by our practice on 06/02/2018. Linda Gomez  has a past medical history of Allergy, Anxiety, Arthritis, Bilateral myofascial pain, Depression, Fibromyalgia, GERD (gastroesophageal reflux disease),  Hyperlipidemia, Hypertension, Neuromuscular disorder (HCC), Restless leg syndrome, Trigeminal neuralgia, Trigeminal neuralgia, Trigeminal neuralgia, and Trigeminal neuropathy. She also  has a past surgical history that includes Brain surgery and Cesarean section. Linda Gomez has a current medication list which includes the following prescription(s): cholecalciferol, dihydroergotamine, fremanezumab-vfrm, pravastatin, pregabalin, and tapentadol. She  reports that she has never smoked. She has never used smokeless tobacco. She reports current alcohol use of about 1.0 standard drinks of alcohol per week. She reports that she does not use drugs. Linda Gomez has No Known Allergies.   HPI  I last communicated with her on 05/11/2018. Today, she is being contacted for medication management.  According to the patient the downward taper on the Nucynta was uneventful.  She indicated that she was doing great until she got to 1 tablet/day at which time she then realized that she was experiencing more pain than usual.  She stopped the medicine but approximately 4 days later she found 1 more pill that she took at bedtime.  This was last night.  At this point she does not have any more pills.  Her "Drug Holiday" starts today and she should be off of it for approximately 14 consecutive days.  The patient was instructed that she could take up to 2 extra strength Tylenol every 8 hours in addition to 2 ibuprofen 200 mg OTC pills.  She was warned to make sure to take it with some food.  She was also instructed not to take it unless she felt that it was necessary.  With regards to the Lyrica, the patient indicates that it does seem to be helping her pain, but she also feels that when she went to 3 tablets/day it gave her some degree of cognitive impairment where she thought that she was having difficulty  thinking clear.  The patient was instructed to go down to 2 tablets/day until that goes away and then later on she can attempt to  increase it to 3 tablets/day.  She has 2 refills on this and therefore she will need anymore for now.  With regards to the Nucynta, I have sent a prescription to her pharmacy for Nucynta 50 mg to be taken 1 tablet p.o. every 8 hours.  The patient was warned that after the 14-day "Drug Holiday", she should be able to get a lot better relief with less medicine.  She understood and accepted.  I had the patient repeat back to me the instructions and she did that without any problems.  I will see her back in approximately 5 weeks for another refill on her Nucynta and to see how she is doing with that Lyrica.  With regards to the Lyrica, we could continue increasing that to as much as 150 mg 3 times daily, if needed and tolerated.  Today I also spoke to the patient about the interventional options and how they will become available to her as soon as the COVID-19 restrictions are lifted.  Pharmacotherapy Assessment  Analgesic: Nucynta 50 mg (this was tapered down over a couple weeks to the point where she had her last pill the last night) the patient is pending to complete a "Drug Holiday". MME/day: 0 mg/day.   Monitoring: Pharmacotherapy: No side-effects or adverse reactions reported. [place] PMP: PDMP reviewed during this encounter.          Compliance: No problems identified or detected. Plan: Refer to "POC".  Review of recent tests  DG Cervical Spine With Flex & Extend CLINICAL DATA:  Neck pain radiating into both shoulders.  EXAM: CERVICAL SPINE COMPLETE WITH FLEXION AND EXTENSION VIEWS  COMPARISON:  None.  FINDINGS: Mild degenerative disc disease with mild disc space narrowing and osteophyte formation at C5-6 and C6-7. Right oblique view shows no evidence of bony foraminal stenosis. The left oblique view is not as well positioned to evaluate the foramina.  No listhesis in the neutral position. There is fair motion with flexion and extension without evidence of instability. No fracture or bony  lesion identified. No prevertebral soft tissue swelling.  IMPRESSION: Mild degenerative disc disease at C5-6 and C6-7. No listhesis or evidence of instability with flexion or extension.  Electronically Signed   By: Irish LackGlenn  Yamagata M.D.   On: 04/15/2018 16:52   Abstract on 04/16/2018  Component Date Value Ref Range Status  . HM Mammogram 03/11/2017 0-4 Bi-Rad  0-4 Bi-Rad, Self Reported Normal Final   Assessment  The primary encounter diagnosis was Chronic facial pain (Primary Area of Pain) (left). Diagnoses of Chronic neck pain (Secondary Area of Pain) (Bilateral) (L>R), Cervicogenic headache, Trigeminal neuralgia (Left), DDD (degenerative disc disease), cervical, and Chronic pain syndrome were also pertinent to this visit.  Plan of Care  I have discontinued Linda Gomez's tapentadol HCl. I have also changed her tapentadol. Additionally, I am having her maintain her Cholecalciferol, Fremanezumab-vfrm, pravastatin, dihydroergotamine, and pregabalin.  Pharmacotherapy (Medications Ordered): Meds ordered this encounter  Medications  . tapentadol (NUCYNTA) 50 MG tablet    Sig: Take 1 tablet (50 mg total) by mouth every 8 (eight) hours as needed for up to 30 days for severe pain. Must last 30 days.    Dispense:  90 tablet    Refill:  0    [place] STOP ACT - Not applicable to Chronic Pain Syndrome (G89.4) diagnosis. Fill one  day early if pharmacy is closed on scheduled refill date. Do not fill until: 06/17/18. To last until: 07/17/18.   Orders:  No orders of the defined types were placed in this encounter.  Follow-up plan:   Return in about 5 weeks (around 07/09/2018) for Med-Mgmt, (Virtual Visit).   Considering:   Diagnostic left trigeminal nerve block  Diagnosticleft greater occipital nerve block  Diagnostic trigger point injections  Diagnostic bilateral cervical facet nerve blocks   Possible bilateral cervical facet RFA    PRN Procedures:   None at this time   I discussed the  assessment and treatment plan with the patient. The patient was provided an opportunity to ask questions and all were answered. The patient agreed with the plan and demonstrated an understanding of the instructions.  Patient advised to call back or seek an in-person evaluation if the symptoms or condition worsens.  Total duration of non-face-to-face encounter: 27 minutes.  Note by: Oswaldo Done, MD Date: 06/04/2018; Time: 9:28 AM  Disclaimer:  * Given the special circumstances of the COVID-19 pandemic, the federal government has announced that the Office for Civil Rights (OCR) will exercise its enforcement discretion and will not impose penalties on physicians using telehealth in the event of noncompliance with regulatory requirements under the [employer] Portability and Accountability Act (HIPAA) in connection with the good faith provision of telehealth during the COVID-19 national public health emergency. (AMA)

## 2018-06-04 ENCOUNTER — Other Ambulatory Visit: Payer: Self-pay

## 2018-06-04 ENCOUNTER — Ambulatory Visit: Attending: Pain Medicine | Admitting: Pain Medicine

## 2018-06-04 DIAGNOSIS — R51 Headache: Secondary | ICD-10-CM

## 2018-06-04 DIAGNOSIS — R519 Headache, unspecified: Secondary | ICD-10-CM

## 2018-06-04 DIAGNOSIS — G8929 Other chronic pain: Secondary | ICD-10-CM

## 2018-06-04 DIAGNOSIS — G894 Chronic pain syndrome: Secondary | ICD-10-CM

## 2018-06-04 DIAGNOSIS — M5030 Other cervical disc degeneration, unspecified cervical region: Secondary | ICD-10-CM | POA: Diagnosis not present

## 2018-06-04 DIAGNOSIS — G500 Trigeminal neuralgia: Secondary | ICD-10-CM

## 2018-06-04 DIAGNOSIS — M542 Cervicalgia: Secondary | ICD-10-CM

## 2018-06-04 DIAGNOSIS — G4486 Cervicogenic headache: Secondary | ICD-10-CM

## 2018-06-04 MED ORDER — TAPENTADOL HCL 50 MG PO TABS: 50.0000 mg | ORAL_TABLET | Freq: Three times a day (TID) | ORAL | 0 refills | Status: DC | PRN

## 2018-06-04 NOTE — Patient Instructions (Addendum)
____________________________________________________________________________________________  Drug Holidays (Slow)  What is a "Drug Holiday"? Drug Holiday: is the name given to the period of time during which a patient stops taking a medication(s) for the purpose of eliminating tolerance to the drug.  Benefits . Improved effectiveness of opioids. . Decreased opioid dose needed to achieve benefits. . Improved pain with lesser dose.  What is tolerance? Tolerance: is the progressive decreased in effectiveness of a drug due to its repetitive use. With repetitive use, the body gets use to the medication and as a consequence, it loses its effectiveness. This is a common problem seen with opioid pain medications. As a result, a larger dose of the drug is needed to achieve the same effect that used to be obtained with a smaller dose.  How long should a "Drug Holiday" last? You should stay off of the pain medicine for at least 14 consecutive days. (2 weeks)  Should I stop the medicine "cold turkey"? No. You should always coordinate with your Pain Specialist so that he/she can provide you with the correct medication dose to make the transition as smoothly as possible.  How do I stop the medicine? Slowly. You will be instructed to decrease the daily amount of pills that you take by one (1) pill every seven (7) days. This is called a "slow downward taper" of your dose. For example: if you normally take four (4) pills per day, you will be asked to drop this dose to three (3) pills per day for seven (7) days, then to two (2) pills per day for seven (7) days, then to one (1) per day for seven (7) days, and at the end of those last seven (7) days, this is when the "Drug Holiday" would start.   Will I have withdrawals? By doing a "slow downward taper" like this one, it is unlikely that you will experience any significant withdrawal symptoms. Typically, what triggers withdrawals is the sudden stop of a high  dose opioid therapy. Withdrawals can usually be avoided by slowly decreasing the dose over a prolonged period of time.  What are withdrawals? Withdrawals: refers to the wide range of symptoms that occur after stopping or dramatically reducing opiate drugs after heavy and prolonged use. Withdrawal symptoms do not occur to patients that use low dose opioids, or those who take the medication sporadically. Contrary to benzodiazepine (example: Valium, Xanax, etc.) or alcohol withdrawals ("Delirium Tremens"), opioid withdrawals are not lethal. Withdrawals are the physical manifestation of the body getting rid of the excess receptors.  Expected Symptoms Early symptoms of withdrawal may include: . Agitation . Anxiety . Muscle aches . Increased tearing . Insomnia . Runny nose . Sweating . Yawning  Late symptoms of withdrawal may include: . Abdominal cramping . Diarrhea . Dilated pupils . Goose bumps . Nausea . Vomiting  Will I experience withdrawals? Due to the slow nature of the taper, it is very unlikely that you will experience any.  What is a slow taper? Taper: refers to the gradual decrease in dose.  ___________________________________________________________________________________________    ____________________________________________________________________________________________  Medication Rules  Purpose: To inform patients, and their family members, of our rules and regulations.  Applies to: All patients receiving prescriptions (written or electronic).  Pharmacy of record: Pharmacy where electronic prescriptions will be sent. If written prescriptions are taken to a different pharmacy, please inform the nursing staff. The pharmacy listed in the electronic medical record should be the one where you would like electronic prescriptions to be sent.  Electronic   prescriptions: In compliance with the [place] (STOP) Act of 2017 (Session  Lanny Cramp [phone]), effective January 28, 2018, all controlled substances must be electronically prescribed. Calling prescriptions to the pharmacy will cease to exist.  Prescription refills: Only during scheduled appointments. Applies to all prescriptions.  NOTE: The following applies primarily to controlled substances (Opioid* Pain Medications).   Patient's responsibilities: 1. Pain Pills: Bring all pain pills to every appointment (except for procedure appointments). 2. Pill Bottles: Bring pills in original pharmacy bottle. Always bring the newest bottle. Bring bottle, even if empty. 3. Medication refills: You are responsible for knowing and keeping track of what medications you take and those you need refilled. The day before your appointment: write a list of all prescriptions that need to be refilled. The day of the appointment: give the list to the admitting nurse. Prescriptions will be written only during appointments. No prescriptions will be written on procedure days. If you forget a medication: it will not be "Called in", "Faxed", or "electronically sent". You will need to get another appointment to get these prescribed. No early refills. Do not call asking to have your prescription filled early. 4. Prescription Accuracy: You are responsible for carefully inspecting your prescriptions before leaving our office. Have the discharge nurse carefully go over each prescription with you, before taking them home. Make sure that your name is accurately spelled, that your address is correct. Check the name and dose of your medication to make sure it is accurate. Check the number of pills, and the written instructions to make sure they are clear and accurate. Make sure that you are given enough medication to last until your next medication refill appointment. 5. Taking Medication: Take medication as prescribed. When it comes to controlled substances, taking less pills or less frequently than prescribed is  permitted and encouraged. Never take more pills than instructed. Never take medication more frequently than prescribed.  6. Inform other Doctors: Always inform, all of your healthcare providers, of all the medications you take. 7. Pain Medication from other Providers: You are not allowed to accept any additional pain medication from any other Doctor or Healthcare provider. There are two exceptions to this rule. (see below) In the event that you require additional pain medication, you are responsible for notifying us, as stated below. 8. Medication Agreement: You are responsible for carefully reading and following our Medication Agreement. This must be signed before receiving any prescriptions from our practice. Safely store a copy of your signed Agreement. Violations to the Agreement will result in no further prescriptions. (Additional copies of our Medication Agreement are available upon request.) 9. Laws, Rules, & Regulations: All patients are expected to follow all Federal and [employer], Rules, [employer]. Ignorance of the Laws does not constitute a valid excuse. The use of any illegal substances is prohibited. 10. Adopted CDC guidelines & recommendations: Target dosing levels will be at or below 60 MME/day. Use of benzodiazepines** is not recommended.  Exceptions: There are only two exceptions to the rule of not receiving pain medications from other Healthcare Providers. 1. Exception #1 (Emergencies): In the event of an emergency (i.e.: accident requiring emergency care), you are allowed to receive additional pain medication. However, you are responsible for: As soon as you are able, call our office [phone], at any time of the day or night, and leave a message stating your name, the date and nature of the emergency, and the name and dose of the medication  prescribed. In the event that your call is answered by a member of our staff, make sure to document and save the date, time, and  the name of the person that took your information.  2. Exception #2 (Planned Surgery): In the event that you are scheduled by another doctor or dentist to have any type of surgery or procedure, you are allowed (for a period no longer than 30 days), to receive additional pain medication, for the acute post-op pain. However, in this case, you are responsible for picking up a copy of our "Post-op Pain Management for Surgeons" handout, and giving it to your surgeon or dentist. This document is available at our office, and does not require an appointment to obtain it. Simply go to our office during business hours (Monday-Thursday from 8:00 AM to 4:00 PM) (Friday 8:00 AM to 12:00 Noon) or if you have a scheduled appointment with us, prior to your surgery, and ask for it by name. In addition, you will need to provide us with your name, name of your surgeon, type of surgery, and date of procedure or surgery.  *Opioid medications include: morphine, codeine, oxycodone, oxymorphone, hydrocodone, hydromorphone, meperidine, tramadol, tapentadol, buprenorphine, fentanyl, methadone. **Benzodiazepine medications include: diazepam (Valium), alprazolam (Xanax), clonazepam (Klonopine), lorazepam (Ativan), clorazepate (Tranxene), chlordiazepoxide (Librium), estazolam (Prosom), oxazepam (Serax), temazepam (Restoril), triazolam (Halcion) (Last updated: 03/27/2017) ____________________________________________________________________________________________   ____________________________________________________________________________________________  Medication Recommendations and Reminders  Applies to: All patients receiving prescriptions (written and/or electronic).  Medication Rules & Regulations: These rules and regulations exist for your safety and that of others. They are not flexible and neither are we. Dismissing or ignoring them will be considered "non-compliance" with medication therapy, resulting in complete and  irreversible termination of such therapy. (See document titled "Medication Rules" for more details.) In all conscience, because of safety reasons, we cannot continue providing a therapy where the patient does not follow instructions.  Pharmacy of record:   Definition: This is the pharmacy where your electronic prescriptions will be sent.   We do not endorse any particular pharmacy.  You are not restricted in your choice of pharmacy.  The pharmacy listed in the electronic medical record should be the one where you want electronic prescriptions to be sent.  If you choose to change pharmacy, simply notify our nursing staff of your choice of new pharmacy.  Recommendations:  Keep all of your pain medications in a safe place, under lock and key, even if you live alone.   After you fill your prescription, take 1 week's worth of pills and put them away in a safe place. You should keep a separate, properly labeled bottle for this purpose. The remainder should be kept in the original bottle. Use this as your primary supply, until it runs out. Once it's gone, then you know that you have 1 week's worth of medicine, and it is time to come in for a prescription refill. If you do this correctly, it is unlikely that you will ever run out of medicine.  To make sure that the above recommendation works, it is very important that you make sure your medication refill appointments are scheduled at least 1 week before you run out of medicine. To do this in an effective manner, make sure that you do not leave the office without scheduling your next medication management appointment. Always ask the nursing staff to show you in your prescription , when your medication will be running out. Then arrange for the receptionist to get you a return   appointment, at least 7 days before you run out of medicine. Do not wait until you have 1 or 2 pills left, to come in. This is very poor planning and does not take into consideration  that we may need to cancel appointments due to bad weather, sickness, or emergencies affecting our staff.  "Partial Fill": If for any reason your pharmacy does not have enough pills/tablets to completely fill or refill your prescription, do not allow for a "partial fill". You will need a separate prescription to fill the remaining amount, which we will not provide. If the reason for the partial fill is your insurance, you will need to talk to the pharmacist about payment alternatives for the remaining tablets, but again, do not accept a partial fill.  Prescription refills and/or changes in medication(s):   Prescription refills, and/or changes in dose or medication, will be conducted only during scheduled medication management appointments. (Applies to both, written and electronic prescriptions.)  No refills on procedure days. No medication will be changed or started on procedure days. No changes, adjustments, and/or refills will be conducted on a procedure day. Doing so will interfere with the diagnostic portion of the procedure.  No phone refills. No medications will be "called into the pharmacy".  No Fax refills.  No weekend refills.  No Holliday refills.  No after hours refills.  Remember:  Business hours are:  Monday to Thursday 8:00 AM to 4:00 PM Provider's Schedule: Crystal King, NP - Appointments are:  Medication management: Monday to Thursday 8:00 AM to 4:00 PM Dossie Swor, MD - Appointments are:  Medication management: Monday and Wednesday 8:00 AM to 4:00 PM Procedure day: Tuesday and Thursday 7:30 AM to 4:00 PM Bilal Lateef, MD - Appointments are:  Medication management: Tuesday and Thursday 8:00 AM to 4:00 PM Procedure day: Monday and Wednesday 7:30 AM to 4:00 PM (Last update: 03/27/2017) ____________________________________________________________________________________________     

## 2018-06-08 ENCOUNTER — Ambulatory Visit: Admitting: Pain Medicine

## 2018-06-09 ENCOUNTER — Encounter: Payer: Self-pay | Admitting: Pain Medicine

## 2018-06-09 ENCOUNTER — Encounter: Payer: Self-pay | Admitting: Physician Assistant

## 2018-06-10 ENCOUNTER — Encounter: Payer: Self-pay | Admitting: Physician Assistant

## 2018-06-11 ENCOUNTER — Ambulatory Visit: Admitting: Pain Medicine

## 2018-06-12 ENCOUNTER — Telehealth: Payer: Self-pay | Admitting: Physician Assistant

## 2018-06-12 NOTE — Telephone Encounter (Signed)
Patient states that "stomach issues" are better and she had emailed the wrong provider on mychart. KW

## 2018-06-12 NOTE — Telephone Encounter (Signed)
Can we call this patient and offer E-visit? I think she is confused about who she is contacting via MyChart regarding her stomach pain.

## 2018-07-07 ENCOUNTER — Encounter: Payer: Self-pay | Admitting: Pain Medicine

## 2018-07-08 ENCOUNTER — Other Ambulatory Visit: Payer: Self-pay

## 2018-07-08 ENCOUNTER — Ambulatory Visit: Attending: Pain Medicine | Admitting: Pain Medicine

## 2018-07-08 DIAGNOSIS — G8929 Other chronic pain: Secondary | ICD-10-CM

## 2018-07-08 DIAGNOSIS — M542 Cervicalgia: Secondary | ICD-10-CM | POA: Diagnosis not present

## 2018-07-08 DIAGNOSIS — R51 Headache: Secondary | ICD-10-CM | POA: Diagnosis not present

## 2018-07-08 DIAGNOSIS — G500 Trigeminal neuralgia: Secondary | ICD-10-CM | POA: Diagnosis not present

## 2018-07-08 DIAGNOSIS — M7918 Myalgia, other site: Secondary | ICD-10-CM

## 2018-07-08 DIAGNOSIS — G894 Chronic pain syndrome: Secondary | ICD-10-CM

## 2018-07-08 DIAGNOSIS — M8949 Other hypertrophic osteoarthropathy, multiple sites: Secondary | ICD-10-CM

## 2018-07-08 DIAGNOSIS — G509 Disorder of trigeminal nerve, unspecified: Secondary | ICD-10-CM

## 2018-07-08 DIAGNOSIS — M797 Fibromyalgia: Secondary | ICD-10-CM

## 2018-07-08 DIAGNOSIS — M47812 Spondylosis without myelopathy or radiculopathy, cervical region: Secondary | ICD-10-CM

## 2018-07-08 DIAGNOSIS — M150 Primary generalized (osteo)arthritis: Secondary | ICD-10-CM

## 2018-07-08 DIAGNOSIS — R519 Headache, unspecified: Secondary | ICD-10-CM

## 2018-07-08 DIAGNOSIS — M159 Polyosteoarthritis, unspecified: Secondary | ICD-10-CM | POA: Insufficient documentation

## 2018-07-08 MED ORDER — TIZANIDINE HCL 4 MG PO TABS: 4.0000 mg | ORAL_TABLET | Freq: Every day | ORAL | 0 refills | Status: DC

## 2018-07-08 MED ORDER — PREGABALIN 25 MG PO CAPS: 25.0000 mg | ORAL_CAPSULE | Freq: Every day | ORAL | 0 refills | Status: DC

## 2018-07-08 MED ORDER — MELOXICAM 15 MG PO TABS: 15.0000 mg | ORAL_TABLET | Freq: Every day | ORAL | 0 refills | Status: DC

## 2018-07-08 NOTE — Progress Notes (Signed)
Pain Management Virtual Encounter Note - Virtual Visit via Telephone Telehealth (real-time audio visits between healthcare provider and patient).   Patient's Phone No. & Preferred Pharmacy:  [phone] (home); [phone] (mobile); (Preferred) [phone] [email or web address]  [id number] [place] [address] [facility] [facility] [address] [place] [address] Phone: [phone] Fax: [phone]    Pre-screening note:  Our staff contacted Linda Gomez and offered her an "in person", "face-to-face" appointment versus a telephone encounter. She indicated preferring the telephone encounter, at this time.   Reason for Virtual Visit: COVID-19*  Social distancing based on CDC and AMA recommendations.   I contacted Linda Gomez on 07/08/2018 via telephone.      I clearly identified myself as Oswaldo DoneFrancisco A Ebbie Sorenson, MD. I verified that I was speaking with the correct person using two identifiers (Name: Linda Gomez, and date of birth: 09/10/1965).  Advanced Informed Consent I sought verbal advanced consent from Linda Gomez for virtual visit interactions. I informed [name] of possible security and privacy concerns, risks, and limitations associated with providing "not-in-person" medical evaluation and management services. I also informed [name] of the availability of "in-person" appointments. Finally, I informed her that there would be a charge for the virtual visit and that she could be  personally, fully or partially, financially responsible for it. Linda Gomez expressed understanding and agreed to proceed.   Historic Elements   Linda Gomez is a 53 y.o. year old, female patient evaluated today after her last encounter by our practice on 06/04/2018. Linda Gomez  has a past medical history of Allergy, Anxiety, Arthritis, Bilateral myofascial pain, Depression, Fibromyalgia, GERD (gastroesophageal  reflux disease), Hyperlipidemia, Hypertension, Neuromuscular disorder (HCC), Restless leg syndrome, Trigeminal neuralgia, Trigeminal neuralgia, Trigeminal neuralgia, and Trigeminal neuropathy. She also  has a past surgical history that includes Brain surgery and Cesarean section. Linda Gomez has a current medication list which includes the following prescription(s): black pepper-turmeric, cholecalciferol, duloxetine, fremanezumab-vfrm, omeprazole, pravastatin, pregabalin, meloxicam, and tizanidine. She  reports that she has never smoked. She has never used smokeless tobacco. She reports current alcohol use of about 1.0 standard drinks of alcohol per week. She reports that she does not use drugs. Linda Gomez has No Known Allergies.   HPI  Today, she is being contacted for medication management.  The patient returns today for follow-up status post drug holiday.  The patient indicates that after she completed the drug holiday, she attempted to go to the pharmacy to get her prescription, but it was just not there.  In any case, she has not had any opioid analgesics since she went into the drug holiday and at this point she feels that she may not need it.  She was experiencing some anxiety and she was given Cymbalta, which has taking care of it.  She was also unable to tolerate the Lyrica 25 mg 3 times a day and she has gone to taking it twice a day, which she seems to tolerate much better.  She still complains of some mental fogginess with the use of the Lyrica during the day.  Because of this, I have recommended that she try taking 1 to 2 tablets at bedtime and see if that does the trick.  She indicated that she would try that.  Today I have also started a Mobic 15 mg p.o. daily trial and I have taken over her Zanaflex 4 mg at bedtime.  Apparently with this particular regimen she is doing rather well and says that she would prefer not to go back on the Nucynta since it was costing her over $200 per  month.  Pharmacotherapy Assessment  Analgesic: Nucynta 50 mg 1 tablet p.o. every 8 hours (150 mg/day of tapentadol) (discontinued after her drug holiday) MME/day: 0 mg/day.   Monitoring: [place] PMP: PDMP reviewed during this encounter.        Pertinent Labs   SAFETY SCREENING Profile No results found for: SARSCOV2NAA, COVIDSOURCE, STAPHAUREUS, MRSAPCR, HCVAB, HIV, PREGTESTUR Renal Function Lab Results  Component Value Date   BUN 6 04/15/2018   CREATININE 0.91 04/15/2018   BCR 7 (L) 04/15/2018   GFRAA 83 04/15/2018   GFRNONAA 72 04/15/2018   Hepatic Function Lab Results  Component Value Date   AST 35 04/15/2018   ALT 25 01/06/2018   ALBUMIN 4.7 04/15/2018   UDS Summary  Date Value Ref Range Status  04/15/2018 FINAL  Final    Comment:    ==================================================================== TOXASSURE COMP DRUG ANALYSIS,UR ==================================================================== Test                             Result       Flag       Units Drug Present   Tapentadol                     >4651                   ng/mg creat    Source of tapentadol is a scheduled prescription medication.   Gabapentin                     PRESENT   Oxcarbazepine MHD              PRESENT    Oxcarbazepine MHD is the active metabolite of oxcarbazepine and    eslicarbazepine.   Topiramate                     PRESENT   Acetaminophen                  PRESENT   Diphenhydramine                PRESENT ==================================================================== Test                      Result    Flag   Units      Ref Range   Creatinine              215              mg/dL      >=91>=20 ==================================================================== Declared Medications:  Medication list was not provided. ==================================================================== For clinical consultation, please call [phone]  [phone]. ====================================================================    Note: Above Lab results reviewed.  Recent imaging  DG Cervical Spine With Flex & Extend CLINICAL DATA:  Neck pain radiating into both shoulders.  EXAM: CERVICAL SPINE COMPLETE WITH FLEXION AND EXTENSION VIEWS  COMPARISON:  None.  FINDINGS: Mild degenerative disc disease with mild disc space narrowing and osteophyte formation at C5-6 and C6-7. Right oblique view shows no evidence of bony foraminal stenosis. The left oblique view is not as well positioned to evaluate the foramina.  No listhesis in the neutral position. There is fair motion with flexion and extension without evidence of instability.  No fracture or bony lesion identified. No prevertebral soft tissue swelling.  IMPRESSION: Mild degenerative disc disease at C5-6 and C6-7. No listhesis or evidence of instability with flexion or extension.  Electronically Signed   By: Aletta Edouard M.D.   On: 04/15/2018 16:52  Assessment  The primary encounter diagnosis was Chronic pain syndrome. Diagnoses of Chronic facial pain (Primary Area of Pain) (left), Chronic neck pain (Secondary Area of Pain) (Bilateral) (L>R), Trigeminal neuralgia (Left), Fibromyalgia, Trigeminal neuropathy (Left), Myofascial pain syndrome, Chronic musculoskeletal pain, Osteoarthritis involving multiple joints, and Osteoarthritis of cervical spine were also pertinent to this visit.  Plan of Care  I have discontinued Hassan Rowan R. Scherzer's dihydroergotamine and tapentadol. I have also changed her pregabalin. Additionally, I am having her start on tiZANidine and meloxicam. Lastly, I am having her maintain her Cholecalciferol, Fremanezumab-vfrm, pravastatin, DULoxetine, omeprazole, and Black Pepper-Turmeric (TURMERIC COMPLEX/BLACK PEPPER PO).  Pharmacotherapy (Medications Ordered): Meds ordered this encounter  Medications  . pregabalin (LYRICA) 25 MG capsule    Sig: Take 1-2  capsules (25-50 mg total) by mouth at bedtime for 30 days.    Dispense:  60 capsule    Refill:  0    Fill one day early if pharmacy is closed on scheduled refill date. May substitute for generic if available.  Marland Kitchen tiZANidine (ZANAFLEX) 4 MG tablet    Sig: Take 1 tablet (4 mg total) by mouth at bedtime for 30 days. Must last 30 days    Dispense:  30 tablet    Refill:  0    Fill one day early if pharmacy is closed on scheduled refill date. May substitute for generic if available.  . meloxicam (MOBIC) 15 MG tablet    Sig: Take 1 tablet (15 mg total) by mouth daily for 30 days.    Dispense:  30 tablet    Refill:  0    Do not add this medication to the electronic "Automatic Refill" notification system. Patient may have prescription filled one day early if pharmacy is closed on scheduled refill date.   Orders:  No orders of the defined types were placed in this encounter.  Follow-up plan:   Return in about 4 weeks (around 08/05/2018) for (Virtual), E/M, (Med-Mgmt).  I will be calling this patient back in approximately 1 month to see how she is doing on the Mobic trial.  If she is doing well, I will then go ahead and provide her with 6 months refills on her Zanaflex, Lyrica, and the Mobic.   I discussed the assessment and treatment plan with the patient. The patient was provided an opportunity to ask questions and all were answered. The patient agreed with the plan and demonstrated an understanding of the instructions.  Patient advised to call back or seek an in-person evaluation if the symptoms or condition worsens.  Total duration of non-face-to-face encounter: 20 minutes.  Note by: Gaspar Cola, MD Date: 07/08/2018; Time: 9:53 AM  Note: This dictation was prepared with Dragon dictation. Any transcriptional errors that may result from this process are unintentional.  Disclaimer:  * Given the special circumstances of the COVID-19 pandemic, the federal government has announced that the  Office for Civil Rights (OCR) will exercise its enforcement discretion and will not impose penalties on physicians using telehealth in the event of noncompliance with regulatory requirements under the [place] (HIPAA) in connection with the good faith provision of telehealth during the VEHMC-94 national public health emergency. ([place])

## 2018-07-12 ENCOUNTER — Encounter: Payer: Self-pay | Admitting: Pain Medicine

## 2018-07-23 ENCOUNTER — Encounter: Payer: Self-pay | Admitting: Pain Medicine

## 2018-07-27 ENCOUNTER — Other Ambulatory Visit: Payer: Self-pay

## 2018-07-27 ENCOUNTER — Ambulatory Visit: Attending: Pain Medicine | Admitting: Pain Medicine

## 2018-07-27 DIAGNOSIS — G894 Chronic pain syndrome: Secondary | ICD-10-CM

## 2018-07-27 DIAGNOSIS — R51 Headache: Secondary | ICD-10-CM | POA: Diagnosis not present

## 2018-07-27 DIAGNOSIS — M8949 Other hypertrophic osteoarthropathy, multiple sites: Secondary | ICD-10-CM

## 2018-07-27 DIAGNOSIS — G4486 Cervicogenic headache: Secondary | ICD-10-CM

## 2018-07-27 DIAGNOSIS — M159 Polyosteoarthritis, unspecified: Secondary | ICD-10-CM

## 2018-07-27 DIAGNOSIS — G500 Trigeminal neuralgia: Secondary | ICD-10-CM

## 2018-07-27 DIAGNOSIS — G509 Disorder of trigeminal nerve, unspecified: Secondary | ICD-10-CM

## 2018-07-27 DIAGNOSIS — M542 Cervicalgia: Secondary | ICD-10-CM

## 2018-07-27 DIAGNOSIS — M797 Fibromyalgia: Secondary | ICD-10-CM

## 2018-07-27 DIAGNOSIS — M5030 Other cervical disc degeneration, unspecified cervical region: Secondary | ICD-10-CM | POA: Diagnosis not present

## 2018-07-27 DIAGNOSIS — M47812 Spondylosis without myelopathy or radiculopathy, cervical region: Secondary | ICD-10-CM

## 2018-07-27 DIAGNOSIS — M7918 Myalgia, other site: Secondary | ICD-10-CM

## 2018-07-27 DIAGNOSIS — G8929 Other chronic pain: Secondary | ICD-10-CM

## 2018-07-27 DIAGNOSIS — M150 Primary generalized (osteo)arthritis: Secondary | ICD-10-CM

## 2018-07-27 MED ORDER — PREGABALIN 25 MG PO CAPS: 25.0000 mg | ORAL_CAPSULE | Freq: Three times a day (TID) | ORAL | 0 refills | Status: DC

## 2018-07-27 MED ORDER — TIZANIDINE HCL 4 MG PO TABS: 4.0000 mg | ORAL_TABLET | Freq: Every day | ORAL | 0 refills | Status: DC

## 2018-07-27 MED ORDER — MELOXICAM 15 MG PO TABS: 15.0000 mg | ORAL_TABLET | Freq: Every day | ORAL | 0 refills | Status: DC

## 2018-07-27 NOTE — Progress Notes (Signed)
Pain Management Virtual Encounter Note - Virtual Visit via Telephone Telehealth (real-time audio visits between healthcare provider and patient).   Patient's Phone No. & Preferred Pharmacy:  [phone] (home); [phone] (mobile); (Preferred) [phone] [email or web address]  [id number] - [place], [place] [address] [place] [address] Phone: [phone] Fax: [phone]    Pre-screening note:  Our staff contacted Linda Gomez and offered her an "in person", "face-to-face" appointment versus a telephone encounter. She indicated preferring the telephone encounter, at this time.   Reason for Virtual Visit: COVID-19*  Social distancing based on CDC and AMA recommendations.   I contacted Linda Gomez on 07/27/2018 via telephone.      I clearly identified myself as Linda Cola, MD. I verified that I was speaking with the correct person using two identifiers (Name: Linda Gomez, and date of birth: 1965/08/20).  Advanced Informed Consent I sought verbal advanced consent from Linda Gomez for virtual visit interactions. I informed Linda Gomez of possible security and privacy concerns, risks, and limitations associated with providing "not-in-person" medical evaluation and management services. I also informed Linda Gomez of the availability of "in-person" appointments. Finally, I informed her that there would be a charge for the virtual visit and that she could be  personally, fully or partially, financially responsible for it. Linda Gomez expressed understanding and agreed to proceed.   Historic Elements   Linda Gomez is a 53 y.o. year old, female patient evaluated today after her last encounter by our practice on 07/08/2018. Linda Gomez  has a past medical history of Allergy, Anxiety, Arthritis, Bilateral myofascial pain, Depression, Fibromyalgia, GERD  (gastroesophageal reflux disease), Hyperlipidemia, Hypertension, Neuromuscular disorder ([place]), Restless leg syndrome, Trigeminal neuralgia, Trigeminal neuralgia, Trigeminal neuralgia, and Trigeminal neuropathy. She also  has a past surgical history that includes Brain surgery and Cesarean section. Linda Gomez has a current medication list which includes the following prescription(s): black pepper-turmeric, cholecalciferol, duloxetine, fremanezumab-vfrm, meloxicam, omeprazole, pravastatin, pregabalin, and tizanidine. She  reports that she has never smoked. She has never used smokeless tobacco. She reports current alcohol use of about 1.0 standard drinks of alcohol per week. She reports that she does not use drugs. Linda Gomez has No Known Allergies.   HPI  Today, she is being contacted for medication management.  This is a follow-up on the patient after she completely came off of the Nucynta, after having completed a drug holiday.  She is now on Lyrica, Mobic, and Zanaflex.  The plan was to continue titrating the Lyrica and Zanaflex until she is comfortable.  Today's encounter is to evaluate how she is doing with this new regimen.  The patient indicates that she was able to take the Lyrica 25 mg p.o. twice daily much better than attempting to take 2 tablets at bedtime.  She is no longer in need of the opioid analgesic.  She still having some neck pain and headaches.  This seems to be associated with her cervical DDD.  She describes having some weakness in the upper extremities and dropping things in the morning.  She also describes having neck pain and headaches that do started in the back of the head and they seem to be worse on the left side.  Based on the description of her symptoms, I would seem that she is having a cervical radiculitis.  Because of this reason we will be bringing the patient in for  her first in a possible series of 3 left-sided cervical epidural steroid injections under fluoroscopic guidance  and IV sedation.  Pharmacotherapy Assessment  Opioid analgesic: Discontinued after drug holiday.  The patient finished her downward taper on 06/08/2018.  At this point she has been 49 days off all opioid analgesics. MME/day: 0 mg/day.   Monitoring: Pharmacotherapy: No side-effects or adverse reactions reported. [place] PMP: PDMP reviewed during this encounter.       Compliance: No problems identified. Effectiveness: Clinically acceptable. Plan: Refer to "POC".  Pertinent Labs   SAFETY SCREENING Profile No results found for: SARSCOV2NAA, COVIDSOURCE, STAPHAUREUS, MRSAPCR, HCVAB, HIV, PREGTESTUR Renal Function Lab Results  Component Value Date   BUN 6 04/15/2018   CREATININE 0.91 04/15/2018   BCR 7 (L) 04/15/2018   GFRAA 83 04/15/2018   GFRNONAA 72 04/15/2018   Hepatic Function Lab Results  Component Value Date   AST 35 04/15/2018   ALT 25 01/06/2018   ALBUMIN 4.7 04/15/2018   UDS Summary  Date Value Ref Range Status  04/15/2018 FINAL  Final    Comment:    ==================================================================== TOXASSURE COMP DRUG ANALYSIS,UR ==================================================================== Test                             Result       Flag       Units Drug Present   Tapentadol                     >4651                   ng/mg creat    Source of tapentadol is a scheduled prescription medication.   Gabapentin                     PRESENT   Oxcarbazepine MHD              PRESENT    Oxcarbazepine MHD is the active metabolite of oxcarbazepine and    eslicarbazepine.   Topiramate                     PRESENT   Acetaminophen                  PRESENT   Diphenhydramine                PRESENT ==================================================================== Test                      Result    Flag   Units      Ref Range   Creatinine              215              mg/dL       >=16>=20 ==================================================================== Declared Medications:  Medication list was not provided. ==================================================================== For clinical consultation, please call [phone]. ====================================================================    Note: Above Lab results reviewed.  Recent imaging  DG Cervical Spine With Flex & Extend CLINICAL DATA:  Neck pain radiating into both shoulders.  EXAM: CERVICAL SPINE COMPLETE WITH FLEXION AND EXTENSION VIEWS  COMPARISON:  None.  FINDINGS: Mild degenerative disc disease with mild disc space narrowing and osteophyte formation at C5-6 and C6-7. Right oblique view shows no evidence of bony foraminal stenosis. The left oblique view is not as well positioned to evaluate the foramina.  No listhesis in the  neutral position. There is fair motion with flexion and extension without evidence of instability. No fracture or bony lesion identified. No prevertebral soft tissue swelling.  IMPRESSION: Mild degenerative disc disease at C5-6 and C6-7. No listhesis or evidence of instability with flexion or extension.  Electronically Signed   By: Irish LackGlenn  Yamagata M.D.   On: 04/15/2018 16:52  Assessment  The primary encounter diagnosis was Chronic neck pain (Secondary Area of Pain) (Bilateral) (L>R). Diagnoses of DDD (degenerative disc disease), cervical, Osteoarthritis of cervical spine, Cervicogenic headache, Chronic facial pain (Primary Area of Pain) (left), Trigeminal neuralgia (Left), Trigeminal neuropathy (Left), Fibromyalgia, Chronic pain syndrome, Myofascial pain syndrome, Chronic musculoskeletal pain, and Osteoarthritis involving multiple joints were also pertinent to this visit.  Plan of Care  I have changed Linda Gomez pregabalin. I am also having her maintain her Cholecalciferol, Fremanezumab-vfrm, pravastatin, DULoxetine, omeprazole, Black Pepper-Turmeric  (TURMERIC COMPLEX/BLACK PEPPER PO), tiZANidine, and meloxicam.  Pharmacotherapy (Medications Ordered): Meds ordered this encounter  Medications  . tiZANidine (ZANAFLEX) 4 MG tablet    Sig: Take 1 tablet (4 mg total) by mouth at bedtime for 30 days. Must last 30 days    Dispense:  30 tablet    Refill:  0    Fill one day early if pharmacy is closed on scheduled refill date. May substitute for generic if available.  . meloxicam (MOBIC) 15 MG tablet    Sig: Take 1 tablet (15 mg total) by mouth daily for 30 days.    Dispense:  30 tablet    Refill:  0    Do not add this medication to the electronic "Automatic Refill" notification system. Patient may have prescription filled one day early if pharmacy is closed on scheduled refill date.  . pregabalin (LYRICA) 25 MG capsule    Sig: Take 1 capsule (25 mg total) by mouth 3 (three) times daily for 30 days.    Dispense:  90 capsule    Refill:  0    Fill one day early if pharmacy is closed on scheduled refill date. May substitute for generic if available.   Orders:  Orders Placed This Encounter  Procedures  . Cervical Epidural Injection    Level(s): C7-T1 Laterality: Left-sided Purpose: Diagnostic Indication(s): Radiculitis and cervicalgia associater with cervical degenerative disc disease.    Standing Status:   Future    Standing Expiration Date:   08/26/2018    Scheduling Instructions:     Procedure: Cervical Epidural Steroid Injection/Block     Sedation: With Sedation.     Timeframe: As soon as schedule allows    Order Specific Question:   Where will this procedure be performed?    Answer:   [facility]    Comments:   by Dr. Laban EmperorNaveira   Follow-up plan:   Return for Procedure (w/ sedation): (L) CESI #1.  In addition, we have been titrating her Lyrica up.  Today we have increased it from Lyrica 25 mg p.o. twice daily to 3 times daily.   Recent Visits Date Type Provider Dept  07/08/18 Office Visit Delano MetzNaveira, Tashea Othman, MD [facility]  06/04/18 Office Visit Delano MetzNaveira, Caresse Sedivy, MD [facility]  05/11/18 Office Visit Delano MetzNaveira, Abdulmalik Darco, MD [facility]  Showing recent visits within past 90 days and meeting all other requirements   Today's Visits Date Type Provider Dept  07/27/18 Office Visit Delano MetzNaveira, Basel Defalco, MD [facility]  Showing today's visits and meeting all other requirements   Future Appointments No visits were found  meeting these conditions.  Showing future appointments within next 90 days and meeting all other requirements   I discussed the assessment and treatment plan with the patient. The patient was provided an opportunity to ask questions and all were answered. The patient agreed with the plan and demonstrated an understanding of the instructions.  Patient advised to call back or seek an in-person evaluation if the symptoms or condition worsens.  Total duration of non-face-to-face encounter: 22 minutes.  Note by: Oswaldo DoneFrancisco A Grazia Taffe, MD Date: 07/27/2018; Time: 9:40 AM  Note: This dictation was prepared with Dragon dictation. Any transcriptional errors that may result from this process are unintentional.  Disclaimer:  * Given the special circumstances of the COVID-19 pandemic, the federal government has announced that the Office for Civil Rights (OCR) will exercise its enforcement discretion and will not impose penalties on physicians using telehealth in the event of noncompliance with regulatory requirements under the [employer] Portability and Accountability Act (HIPAA) in connection with the good faith provision of telehealth during the COVID-19 national public health emergency. ([place])

## 2018-07-27 NOTE — Patient Instructions (Signed)

## 2018-08-03 ENCOUNTER — Other Ambulatory Visit: Payer: Self-pay

## 2018-08-10 ENCOUNTER — Other Ambulatory Visit: Payer: Self-pay | Admitting: Pain Medicine

## 2018-08-10 ENCOUNTER — Other Ambulatory Visit: Payer: Self-pay

## 2018-08-10 ENCOUNTER — Other Ambulatory Visit
Admission: RE | Admit: 2018-08-10 | Discharge: 2018-08-10 | Disposition: A | Discharge status: Home / Self Care | Source: Ambulatory Visit | Attending: Pain Medicine | Admitting: Pain Medicine

## 2018-08-10 DIAGNOSIS — M47812 Spondylosis without myelopathy or radiculopathy, cervical region: Secondary | ICD-10-CM

## 2018-08-10 DIAGNOSIS — M159 Polyosteoarthritis, unspecified: Secondary | ICD-10-CM

## 2018-08-10 DIAGNOSIS — Z1159 Encounter for screening for other viral diseases: Secondary | ICD-10-CM | POA: Insufficient documentation

## 2018-08-10 DIAGNOSIS — M542 Cervicalgia: Secondary | ICD-10-CM

## 2018-08-10 DIAGNOSIS — M8949 Other hypertrophic osteoarthropathy, multiple sites: Secondary | ICD-10-CM

## 2018-08-10 DIAGNOSIS — G8929 Other chronic pain: Secondary | ICD-10-CM

## 2018-08-11 LAB — SARS CORONAVIRUS 2 (TAT 6-24 HRS): SARS Coronavirus 2: NEGATIVE

## 2018-08-12 ENCOUNTER — Telehealth: Payer: Self-pay | Admitting: *Deleted

## 2018-08-12 DIAGNOSIS — M542 Cervicalgia: Secondary | ICD-10-CM | POA: Insufficient documentation

## 2018-08-12 NOTE — Telephone Encounter (Signed)
Called the pharmacy and they have the prescription.  Patient notified.

## 2018-08-12 NOTE — Progress Notes (Signed)
Patient's Name: Linda Gomez  MRN: [id number]  Referring Provider: Delano Metz, MD  DOB: [birth year]  PCP: Trey Sailors, PA-C  DOS: 08/13/2018  Note by: Oswaldo Done, MD  Service setting: Ambulatory outpatient  Specialty: Interventional Pain Management  Patient type: Established  Location: [facility] (AMB) Pain Management Facility  Visit type: Interventional Procedure   Primary Reason for Visit: Interventional Pain Management Treatment. CC: Neck Pain (leftsided)  Procedure:          Anesthesia, Analgesia, Anxiolysis:  Type: Diagnostic, Inter-Laminar, Cervical Epidural Steroid Injection  #1  Region: Posterior Cervico-thoracic Region Level: C7-T1 Laterality: Left-Sided Paramedial  Type: Moderate (Conscious) Sedation combined with Local Anesthesia Indication(s): Analgesia and Anxiety Route: Intravenous (IV) IV Access: Secured Sedation: Meaningful verbal contact was maintained at all times during the procedure  Local Anesthetic: Lidocaine 1-2%  Position: Prone with head of the table was raised to facilitate breathing.   Indications: 1. Cervicalgia   2. DDD (degenerative disc disease), cervical   3. Cervicogenic headache   4. Chronic neck pain (Secondary Area of Pain) (Bilateral) (L>R)   5. Osteoarthritis of cervical spine   6. Trigeminal neuropathy (Left)   7. Trigeminal neuralgia (Left)    Pain Score: Pre-procedure: 4 /10 Post-procedure: 0-No pain/10  Pertinent Labs  COVID-19 screennig: Lab Results  Component Value Date   SARSCOV2NAA NEGATIVE 08/10/2018   Pre-op Assessment:  Linda Gomez is a 53 y.o. (year old), female patient, seen today for interventional treatment. She  has a past surgical history that includes Brain surgery and Cesarean section. Linda Gomez has a current medication list which includes the following prescription(s): cholecalciferol, fremanezumab-vfrm, meloxicam, omeprazole, pravastatin, pregabalin, tizanidine, black pepper-turmeric, and  duloxetine, and the following Facility-Administered Medications: fentanyl and midazolam. Her primarily concern today is the Neck Pain (leftsided)  Initial Vital Signs:  Pulse/HCG Rate: 78ECG Heart Rate: 86 Temp: 98.1 F (36.7 C) Resp: 18 BP: 130/79 SpO2: 100 %  BMI: Estimated body mass index is 25.86 kg/m as calculated from the following:   Height as of this encounter:  (1.6 m).   Weight as of this encounter: 146 lb (66.2 kg).  Risk Assessment: Allergies: Reviewed. She has No Known Allergies.  Allergy Precautions: None required Coagulopathies: Reviewed. None identified.  Blood-thinner therapy: None at this time Active Infection(s): Reviewed. None identified. Linda Gomez is afebrile  Site Confirmation: Linda Gomez was asked to confirm the procedure and laterality before marking the site Procedure checklist: Completed Consent: Before the procedure and under the influence of no sedative(s), amnesic(s), or anxiolytics, the patient was informed of the treatment options, risks and possible complications. To fulfill our ethical and legal obligations, as recommended by the American Medical Association's Code of Ethics, I have informed the patient of my clinical impression; the nature and purpose of the treatment or procedure; the risks, benefits, and possible complications of the intervention; the alternatives, including doing nothing; the risk(s) and benefit(s) of the alternative treatment(s) or procedure(s); and the risk(s) and benefit(s) of doing nothing. The patient was provided information about the general risks and possible complications associated with the procedure. These may include, but are not limited to: failure to achieve desired goals, infection, bleeding, organ or nerve damage, allergic reactions, paralysis, and death. In addition, the patient was informed of those risks and complications associated to Spine-related procedures, such as failure to decrease pain; infection (i.e.:  Meningitis, epidural or intraspinal abscess); bleeding (i.e.: epidural hematoma, subarachnoid hemorrhage, or any other type of intraspinal or peri-dural bleeding); organ  or nerve damage (i.e.: Any type of peripheral nerve, nerve root, or spinal cord injury) with subsequent damage to sensory, motor, and/or autonomic systems, resulting in permanent pain, numbness, and/or weakness of one or several areas of the body; allergic reactions; (i.e.: anaphylactic reaction); and/or death. Furthermore, the patient was informed of those risks and complications associated with the medications. These include, but are not limited to: allergic reactions (i.e.: anaphylactic or anaphylactoid reaction(s)); adrenal axis suppression; blood sugar elevation that in diabetics may result in ketoacidosis or comma; water retention that in patients with history of congestive heart failure may result in shortness of breath, pulmonary edema, and decompensation with resultant heart failure; weight gain; swelling or edema; medication-induced neural toxicity; particulate matter embolism and blood vessel occlusion with resultant organ, and/or nervous system infarction; and/or aseptic necrosis of one or more joints. Finally, the patient was informed that Medicine is not an exact science; therefore, there is also the possibility of unforeseen or unpredictable risks and/or possible complications that may result in a catastrophic outcome. The patient indicated having understood very clearly. We have given the patient no guarantees and we have made no promises. Enough time was given to the patient to ask questions, all of which were answered to the patient's satisfaction. Linda Gomez has indicated that she wanted to continue with the procedure. Attestation: I, the ordering provider, attest that I have discussed with the patient the benefits, risks, side-effects, alternatives, likelihood of achieving goals, and potential problems during recovery for the  procedure that I have provided informed consent. Date  Time: 08/13/2018 10:26 AM  Pre-Procedure Preparation:  Monitoring: As per clinic protocol. Respiration, ETCO2, SpO2, BP, heart rate and rhythm monitor placed and checked for adequate function Safety Precautions: Patient was assessed for positional comfort and pressure points before starting the procedure. Time-out: I initiated and conducted the "Time-out" before starting the procedure, as per protocol. The patient was asked to participate by confirming the accuracy of the "Time Out" information. Verification of the correct person, site, and procedure were performed and confirmed by me, the nursing staff, and the patient. "Time-out" conducted as per Joint Commission's Universal Protocol (UP.01.01.01). Time: 1108  Description of Procedure:          Target Area: For Epidural Steroid injections the target is the interlaminar space, initially targeting the lower border of the superior vertebral body lamina. Approach: Paramedial approach. Area Prepped: Entire PosteriorCervical Region Prepping solution: DuraPrep (Iodine Povacrylex [0.7% available iodine] and Isopropyl Alcohol, 74% w/w) Safety Precautions: Aspiration looking for blood return was conducted prior to all injections. At no point did we inject any substances, as a needle was being advanced. No attempts were made at seeking any paresthesias. Safe injection practices and needle disposal techniques used. Medications properly checked for expiration dates. SDV (single dose vial) medications used. Description of the Procedure: Protocol guidelines were followed. The procedure needle was introduced through the skin, ipsilateral to the reported pain, and advanced to the target area. Bone was contacted and the needle walked caudad, until the lamina was cleared. The epidural space was identified using "loss-of-resistance technique" with 2-3 ml of PF-NaCl (0.9% NSS), in a 5cc LOR glass syringe. Vitals:    08/13/18 1118 08/13/18 1128 08/13/18 1138 08/13/18 1148  BP: 126/90 113/73 113/69 116/81  Pulse: 87     Resp: 12 17 18 20   Temp:      TempSrc:      SpO2: 97% 100% 100% 100%  Weight:      Height:  Start Time: 1108 hrs. End Time: 1117 hrs. Materials:  Needle(s) Type: Epidural needle Gauge: 17G Length: 3.5-in Medication(s): Please see orders for medications and dosing details.  Imaging Guidance (Spinal):          Type of Imaging Technique: Fluoroscopy Guidance (Spinal) Indication(s): Assistance in needle guidance and placement for procedures requiring needle placement in or near specific anatomical locations not easily accessible without such assistance. Exposure Time: Please see nurses notes. Contrast: Before injecting any contrast, we confirmed that the patient did not have an allergy to iodine, shellfish, or radiological contrast. Once satisfactory needle placement was completed at the desired level, radiological contrast was injected. Contrast injected under live fluoroscopy. No contrast complications. See chart for type and volume of contrast used. Fluoroscopic Guidance: I was personally present during the use of fluoroscopy. "Tunnel Vision Technique" used to obtain the best possible view of the target area. Parallax error corrected before commencing the procedure. "Direction-depth-direction" technique used to introduce the needle under continuous pulsed fluoroscopy. Once target was reached, antero-posterior, oblique, and lateral fluoroscopic projection used confirm needle placement in all planes. Images permanently stored in EMR. Interpretation: I personally interpreted the imaging intraoperatively. Adequate needle placement confirmed in multiple planes. Appropriate spread of contrast into desired area was observed. No evidence of afferent or efferent intravascular uptake. No intrathecal or subarachnoid spread observed. Permanent images saved into the patient's  record.  Antibiotic Prophylaxis:   Anti-infectives (From admission, onward)   None     Indication(s): None identified  Post-operative Assessment:  Post-procedure Vital Signs:  Pulse/HCG Rate: 8779 Temp: 98.1 F (36.7 C) Resp: 20 BP: 116/81 SpO2: 100 %  EBL: None  Complications: No immediate post-treatment complications observed by team, or reported by patient.  Note: The patient tolerated the entire procedure well. A repeat set of vitals were taken after the procedure and the patient was kept under observation following institutional policy, for this type of procedure. Post-procedural neurological assessment was performed, showing return to baseline, prior to discharge. The patient was provided with post-procedure discharge instructions, including a section on how to identify potential problems. Should any problems arise concerning this procedure, the patient was given instructions to immediately contact us, at any time, without hesitation. In any case, we plan to contact the patient by telephone for a follow-up status report regarding this interventional procedure.  Comments:  No additional relevant information.  Plan of Care  Orders:  Orders Placed This Encounter  Procedures  . Cervical Epidural Injection    Procedure: Cervical Epidural Steroid Injection/Block Purpose: Diagnostic Indication(s): Radiculitis and cervicalgia associater with cervical degenerative disc disease.    Scheduling Instructions:     Level(s): C7-T1     Laterality: Left-sided     Sedation: Patient's choice.     Timeframe: Today    Order Specific Question:   Where will this procedure be performed?    Answer:   [facility]    Comments:   by Dr. Laban EmperorNaveira  . DG PA[facility] C-ARM 1-60 MIN NO REPORT    Intraoperative interpretation by procedural physician at [facility].    Standing Status:   Standing    Number of Occurrences:   1    Order Specific Question:   Reason for exam:     Answer:   Assistance in needle guidance and placement for procedures requiring needle placement in or near specific anatomical locations not easily accessible without such assistance.  . Provider attestation of informed consent for procedure/surgical case  I, the ordering provider, attest that I have discussed with the patient the benefits, risks, side effects, alternatives, likelihood of achieving goals and potential problems during recovery for the procedure that I have provided informed consent.    Standing Status:   Standing    Number of Occurrences:   1  . Informed Consent Details: Transcribe to consent form and obtain patient signature    Standing Status:   Standing    Number of Occurrences:   1    Order Specific Question:   Procedure    Answer:   Cervical epidural steroid injection under fluoroscopic guidance. (See notes for level and laterality.)    Order Specific Question:   Surgeon    Answer:   Alliyah Roesler A. Dossie Arbour, MD    Order Specific Question:   Indication/Reason    Answer:   Neck pain and/or upper extremity pain secondary to cervical radiculitis/radiculopathy.   Chronic Opioid Analgesic:  Discontinued after drug holiday.  The patient finished her downward taper on 06/08/2018. MME/day: 0 mg/day.   Medications ordered for procedure: Meds ordered this encounter  Medications  . iohexol (OMNIPAQUE) 180 MG/ML injection 10 mL    Must be Myelogram-compatible. If not available, you may substitute with a water-soluble, non-ionic, hypoallergenic, myelogram-compatible radiological contrast medium.  Marland Kitchen lidocaine (XYLOCAINE) 2 % (with pres) injection 400 mg  . lactated ringers infusion 1,000 mL  . midazolam (VERSED) 5 MG/5ML injection 1-2 mg    Make sure Flumazenil is available in the pyxis when using this medication. If oversedation occurs, administer 0.2 mg IV over 15 sec. If after 45 sec no response, administer 0.2 mg again over 1 min; may repeat at 1 min intervals; not to exceed 4 doses  (1 mg)  . fentaNYL (SUBLIMAZE) injection 25-50 mcg    Make sure Narcan is available in the pyxis when using this medication. In the event of respiratory depression (RR< 8/min): Titrate NARCAN (naloxone) in increments of 0.1 to 0.2 mg IV at 2-3 minute intervals, until desired degree of reversal.  . sodium chloride flush (NS) 0.9 % injection 1 mL  . ropivacaine (PF) 2 mg/mL (0.2%) (NAROPIN) injection 1 mL  . dexamethasone (DECADRON) injection 10 mg   Medications administered: We administered iohexol, lidocaine, lactated ringers, midazolam, fentaNYL, sodium chloride flush, ropivacaine (PF) 2 mg/mL (0.2%), and dexamethasone.  See the medical record for exact dosing, route, and time of administration.  Follow-up plan:   No follow-ups on file.       Interventional management options:  Considering:   Diagnostic left-sided CESI #2  Diagnostic left trigeminal nerve block  Diagnosticleft greater occipital nerve block  Diagnostic trigger point injections  Diagnostic bilateral cervical facet nerve blocks   Possible bilateral cervical facet RFA    PRN Procedures:   None at this time    Recent Visits Date Type Provider Dept  07/27/18 Office Visit Milinda Pointer, MD [facility]  07/08/18 Office Visit Milinda Pointer, MD [facility]  06/04/18 Office Visit Milinda Pointer, MD [facility]  Showing recent visits within past 90 days and meeting all other requirements   Today's Visits Date Type Provider Dept  08/13/18 Procedure visit Milinda Pointer, MD [facility]  Showing today's visits and meeting all other requirements   Future Appointments Date Type Provider Dept  08/31/18 Appointment Milinda Pointer, MD [facility]  Showing future appointments within next 90 days and meeting all other requirements   Disposition: Discharge home  Discharge Date & Time: 08/13/2018;  1150 hrs.   Primary Care Physician: Maryella ShiversPollak, Adriana M,  PA-C [facility] Note by: Oswaldo DoneFrancisco A Jerre Diguglielmo, MD Date: 08/13/2018; Time: 11:58 AM  Disclaimer:  Medicine is not an Visual merchandiserexact science. The only guarantee in medicine is that nothing is guaranteed. It is important to note that the decision to proceed with this intervention was based on the information collected from the patient. The Data and conclusions were drawn from the patient's questionnaire, the interview, and the physical examination. Because the information was provided in large part by the patient, it cannot be guaranteed that it has not been purposely or unconsciously manipulated. Every effort has been made to obtain as much relevant data as possible for this evaluation. It is important to note that the conclusions that lead to this procedure are derived in large part from the available data. Always take into account that the treatment will also be dependent on availability of resources and existing treatment guidelines, considered by other Pain Management Practitioners as being common knowledge and practice, at the time of the intervention. For Medico-Legal purposes, it is also important to point out that variation in procedural techniques and pharmacological choices are the acceptable norm. The indications, contraindications, technique, and results of the above procedure should only be interpreted and judged by a Board-Certified Interventional Pain Specialist with extensive familiarity and expertise in the same exact procedure and technique.

## 2018-08-12 NOTE — Patient Instructions (Signed)

## 2018-08-13 ENCOUNTER — Ambulatory Visit: Admitting: Pain Medicine

## 2018-08-13 ENCOUNTER — Ambulatory Visit
Admission: RE | Admit: 2018-08-13 | Discharge: 2018-08-13 | Disposition: A | Discharge status: Home / Self Care | Source: Ambulatory Visit | Attending: Pain Medicine | Admitting: Pain Medicine

## 2018-08-13 ENCOUNTER — Other Ambulatory Visit: Payer: Self-pay

## 2018-08-13 ENCOUNTER — Encounter: Payer: Self-pay | Admitting: Pain Medicine

## 2018-08-13 VITALS — BP 116/81 | HR 87 | Temp 98.1°F | Resp 20 | Ht 63.0 in | Wt 146.0 lb

## 2018-08-13 DIAGNOSIS — M5030 Other cervical disc degeneration, unspecified cervical region: Secondary | ICD-10-CM | POA: Insufficient documentation

## 2018-08-13 DIAGNOSIS — G8929 Other chronic pain: Secondary | ICD-10-CM | POA: Insufficient documentation

## 2018-08-13 DIAGNOSIS — M47812 Spondylosis without myelopathy or radiculopathy, cervical region: Secondary | ICD-10-CM | POA: Insufficient documentation

## 2018-08-13 DIAGNOSIS — G509 Disorder of trigeminal nerve, unspecified: Secondary | ICD-10-CM | POA: Diagnosis present

## 2018-08-13 DIAGNOSIS — G500 Trigeminal neuralgia: Secondary | ICD-10-CM

## 2018-08-13 DIAGNOSIS — R51 Headache: Secondary | ICD-10-CM | POA: Diagnosis present

## 2018-08-13 DIAGNOSIS — M542 Cervicalgia: Secondary | ICD-10-CM

## 2018-08-13 DIAGNOSIS — G4486 Cervicogenic headache: Secondary | ICD-10-CM

## 2018-08-13 MED ORDER — DEXAMETHASONE SODIUM PHOSPHATE 10 MG/ML IJ SOLN
10.0000 mg | Freq: Once | INTRAMUSCULAR | Status: AC
  Administered 2018-08-13: 10 mg
  Filled 2018-08-13: qty 1

## 2018-08-13 MED ORDER — SODIUM CHLORIDE 0.9% FLUSH
1.0000 mL | Freq: Once | INTRAVENOUS | Status: AC
  Administered 2018-08-13: 1 mL

## 2018-08-13 MED ORDER — LACTATED RINGERS IV SOLN
1000.0000 mL | Freq: Once | INTRAVENOUS | Status: AC
  Administered 2018-08-13: 1000 mL via INTRAVENOUS

## 2018-08-13 MED ORDER — ROPIVACAINE HCL 2 MG/ML IJ SOLN
1.0000 mL | Freq: Once | INTRAMUSCULAR | Status: AC
  Administered 2018-08-13: 1 mL via EPIDURAL
  Filled 2018-08-13: qty 10

## 2018-08-13 MED ORDER — SODIUM CHLORIDE (PF) 0.9 % IJ SOLN
INTRAMUSCULAR | Status: AC
  Filled 2018-08-13: qty 10

## 2018-08-13 MED ORDER — FENTANYL CITRATE (PF) 100 MCG/2ML IJ SOLN
25.0000 ug | INTRAMUSCULAR | Status: DC | PRN
  Administered 2018-08-13: 50 ug via INTRAVENOUS
  Filled 2018-08-13: qty 2

## 2018-08-13 MED ORDER — IOHEXOL 180 MG/ML  SOLN
10.0000 mL | Freq: Once | INTRAMUSCULAR | Status: AC
  Administered 2018-08-13: 10 mL via EPIDURAL

## 2018-08-13 MED ORDER — MIDAZOLAM HCL 5 MG/5ML IJ SOLN
1.0000 mg | INTRAMUSCULAR | Status: DC | PRN
  Administered 2018-08-13: 2 mg via INTRAVENOUS
  Filled 2018-08-13: qty 5

## 2018-08-13 MED ORDER — LIDOCAINE HCL 2 % IJ SOLN
20.0000 mL | Freq: Once | INTRAMUSCULAR | Status: AC
  Administered 2018-08-13: 400 mg
  Filled 2018-08-13: qty 20

## 2018-08-13 NOTE — Progress Notes (Signed)
Safety precautions to be maintained throughout the outpatient stay will include: orient to surroundings, keep bed in low position, maintain call bell within reach at all times, provide assistance with transfer out of bed and ambulation.  

## 2018-08-13 NOTE — Progress Notes (Deleted)
TEST 1. Please disregard. 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} -SECCNOHEADERBEGIN-LASTVISITSECTIONCTEXT-SECTIONCEND{There is no content from the last Subjective section.}   Break 4 Section D SECDNOHEADERBEGIN SECTIONDEND  LASTVISITSECTIONDTEXT{There is no content from the last Subjective section.} -SECDNOHEADERBEGIN-LASTVISITSECTIONDTEXT-SECTIONDEND{There is no content from the last Subjective section.}   Break 5 Progress Note (SOAP) Bookmarks: Subjective section: Code to mark where to Begin:     Code to mark where to End:     Code to pull the above section from the last note into the new one: {There is no content from the last Subjective section.}   This brings it together from note to note:   {There is no content from the last Subjective section.}    Objective section: Code to mark where to Begin:     Code to mark where to End:     Code to pull the above section from the last note into the new one: {There is no content from the last Objective section.}   This brings it together from note to note:   {There is no content from the last Objective section.}    Assessment section: Code to mark where to Begin:     Code to mark where to End:     Code to pull the above section from the last note into the new one: {There is no content from the last Assessment section.}   This brings it  together from note to note:   {There is no content from the last Assessment section.}    Plan section: Code to mark where to Begin:     Code to mark where to End:     Code to pull the above section from the last note into the new one: {There is no content from the last Plan section.}   This brings it together from note to note:   {There is no content from the last Plan section.}      Break 6    Break 7   Break 8   Break 9   Break 10   Break 11   Break 12   Break 13   Break 14   Break 15   Break 16   Break 17   Break 18   Break 19   Break 20   Break 21   Break 22   Break 23   Break 24   Break 25   Break 26   Break 27   Break 28   Break 29   Break 31   Break 32   Break 33   Break 34    Break 35

## 2018-08-14 ENCOUNTER — Telehealth: Payer: Self-pay | Admitting: *Deleted

## 2018-08-14 NOTE — Telephone Encounter (Signed)
Doing well after procedure. No issues.

## 2018-08-27 ENCOUNTER — Encounter: Payer: Self-pay | Admitting: Pain Medicine

## 2018-08-30 NOTE — Progress Notes (Signed)
Pain Management Virtual Encounter Note - Virtual Visit via Telephone Telehealth (real-time audio visits between healthcare provider and patient).   Patient's Phone No. & Preferred Pharmacy:  [phone] (home); [phone] (mobile); (Preferred) [phone] Linda [email or web address]  [id number] [place], [place] [address] [facility] [address] [place] [address] Phone: [phone] Fax: [phone]    Pre-screening note:  Our staff contacted [name] and offered her an "in person", "face-to-face" appointment versus a telephone encounter. She indicated preferring the telephone encounter, at this time.   Reason for Virtual Visit: COVID-19*  Social distancing based on CDC and AMA recommendations.   I contacted Linda CarneBrenda Gomez Gomez on 08/31/2018 via telephone.      I clearly identified myself as Oswaldo DoneFrancisco A Kazumi Lachney, MD. I verified that I was speaking with the correct person using two identifiers (Name: Linda Gomez, and date of birth: 06/24/1965).  Advanced Informed Consent I sought verbal advanced consent from Linda Gomez for virtual visit interactions. I informed Linda Gomez of possible security and privacy concerns, risks, and limitations associated with providing "not-in-person" medical evaluation and management services. I also informed Linda Gomez of the availability of "in-person" appointments. Finally, I informed her that there would be a charge for the virtual visit and that she could be  personally, fully or partially, financially responsible for it. Linda Gomez expressed understanding and agreed to proceed.   Historic Elements   Ms. Linda Gomez is a 53 y.o. year old, female patient evaluated today after her last encounter by our practice on 08/14/2018. Linda Gomez  has a past medical history of Allergy, Anxiety, Arthritis, Bilateral myofascial pain, Depression, Fibromyalgia, GERD (gastroesophageal  reflux disease), Hyperlipidemia, Hypertension, Neuromuscular disorder (HCC), Restless leg syndrome, Trigeminal neuralgia, Trigeminal neuralgia, Trigeminal neuralgia, and Trigeminal neuropathy. She also  has a past surgical history that includes Brain surgery and Cesarean section. Linda Gomez has a current medication list which includes the following prescription(s): black pepper-turmeric, cholecalciferol, fexofenadine, fremanezumab-vfrm, meloxicam, omeprazole, pravastatin, pregabalin, and tizanidine. She  reports that she has never smoked. She has never used smokeless tobacco. She reports current alcohol use of about 1.0 standard drinks of alcohol per week. She reports that she does not use drugs. Linda Gomez has No Known Allergies.   HPI  Today, she is being contacted for both, medication management and a post-procedure assessment. Today we have considered increasing the Lyrica and repeating the CESI, but now on the right.  She still concerned about the use of the Lyrica.  She says that at 1 tablet p.o. 3 times daily is making her a little sleepy and is affecting her memory.  Because of this, I have suggested that she go back to twice daily and stay there longer until her body gets used to it and then later on she can try going up on it.  She was also concerned about the Lyrica causing her to gain some weight.  I have reminded the patient that there are several reasons right now where she could be gaining weight.  1 of them is a issue with the COVID pandemic and the fact that nobody is really exercising much and therefore everybody seems to be gaining some weight.  The second reason is the possibility that some of this weight gain may be secondary to the use of the steroids.  Today I have talked to the patient extensively about those steroids and how he can cause her to  gain weight by increasing her appetite and also retaining water.  She indicates having attained excellent benefit from the left cervical  epidural steroid injection but has noticed that the right side is not quite as good.  Today we had talked about the possibility of injecting the right side and she has agreed to do this.  I will go ahead and put her on the schedule and we will plan on doing a right-sided cervical epidural steroid injection #2 under fluoroscopic guidance and IV sedation.  Post-Procedure Evaluation  Procedure: Diagnostic left CESI #1 under fluoroscopic guidance and IV sedation Pre-procedure pain level:  4/10 Post-procedure: 0/10 (100% relief)  Sedation: Sedation provided.  Effectiveness during initial hour after procedure(Ultra-Short Term Relief): 100 %   Local anesthetic used: Long-acting (4-6 hours) Effectiveness: Defined as any analgesic benefit obtained secondary to the administration of local anesthetics. This carries significant diagnostic value as to the etiological location, or anatomical origin, of the pain. Duration of benefit is expected to coincide with the duration of the local anesthetic used.  Effectiveness during initial 4-6 hours after procedure(Short-Term Relief): 100 %   Long-term benefit: Defined as any relief past the pharmacologic duration of the local anesthetics.  Effectiveness past the initial 6 hours after procedure(Long-Term Relief): 100 %(left side is better, right side feels like she is having pulsating pain at the base of skull that is very irritating to her.) Did well until 6-10  Current benefits: Defined as benefit that persist at this time.   Analgesia:  60-70% better Function: Linda Gomez reports improvement in function ROM: Linda Gomez reports improvement in ROM  Pharmacotherapy Assessment  Analgesic: Discontinued after drug holiday.  The patient finished her downward taper on 06/08/2018. MME/day: 0 mg/day.   Monitoring: Pharmacotherapy: No side-effects or adverse reactions reported. [place] PMP: PDMP reviewed during this encounter.       Compliance: No problems  identified. Effectiveness: Clinically acceptable. Plan: Refer to "POC".  UDS:  Summary  Date Value Ref Range Status  04/15/2018 FINAL  Final    Comment:    ==================================================================== TOXASSURE COMP DRUG ANALYSIS,UR ==================================================================== Test                             Result       Flag       Units Drug Present   Tapentadol                     >4651                   ng/mg creat    Source of tapentadol is a scheduled prescription medication.   Gabapentin                     PRESENT   Oxcarbazepine MHD              PRESENT    Oxcarbazepine MHD is the active metabolite of oxcarbazepine and    eslicarbazepine.   Topiramate                     PRESENT   Acetaminophen                  PRESENT   Diphenhydramine                PRESENT ==================================================================== Test  Result    Flag   Units      Ref Range   Creatinine              215              mg/dL      >=16>=20 ==================================================================== Declared Medications:  Medication list was not provided. ==================================================================== For clinical consultation, please call [phone]. ====================================================================    Laboratory Chemistry Profile (12 mo)  Renal: 04/15/2018: BUN 6; BUN/Creatinine Ratio 7; Creatinine, Ser 0.91; GFR calc Af Amer 83; GFR calc non Af Amer 72  Hepatic: 01/06/2018: ALT 25 04/15/2018: Albumin 4.7; AST 35 Other: 04/15/2018: 25-Hydroxy, Vitamin D 33; 25-Hydroxy, Vitamin D-2 <1.0; 25-Hydroxy, Vitamin D-3 33; CRP 3; Sed Rate 36; Vitamin B-12 839 Note: Above Lab results reviewed.  Imaging  Last 90 days:  Dg Pain Clinic C-arm 1-60 Min No Report  Result Date: 08/13/2018 Fluoro was used, but no Radiologist interpretation will be provided. Please refer to  "NOTES" tab for provider progress note.  Last Hospital Admission:  Dg Pain Clinic C-arm 1-60 Min No Report  Result Date: 08/13/2018 Fluoro was used, but no Radiologist interpretation will be provided. Please refer to "NOTES" tab for provider progress note.  Assessment  The primary encounter diagnosis was Chronic pain syndrome. Diagnoses of Chronic neck pain (Secondary Area of Pain) (Bilateral) (L>R), Osteoarthritis of cervical spine, Chronic musculoskeletal pain, Fibromyalgia, Osteoarthritis involving multiple joints, Trigeminal neuralgia (Left), and Trigeminal neuropathy (Left) were also pertinent to this visit.  Plan of Care  I have discontinued Linda Gomez DULoxetine. I have also changed her tiZANidine, meloxicam, and pregabalin. Additionally, I am having her maintain her Cholecalciferol, Fremanezumab-vfrm, pravastatin, omeprazole, Black Pepper-Turmeric (TURMERIC COMPLEX/BLACK PEPPER PO), and fexofenadine.  Pharmacotherapy (Medications Ordered): Meds ordered this encounter  Medications  . tiZANidine (ZANAFLEX) 4 MG tablet    Sig: Take 1 tablet (4 mg total) by mouth at bedtime. Must last 30 days    Dispense:  30 tablet    Refill:  2    Fill one day early if pharmacy is closed on scheduled refill date. May substitute for generic if available.  . meloxicam (MOBIC) 15 MG tablet    Sig: Take 1 tablet (15 mg total) by mouth daily.    Dispense:  30 tablet    Refill:  2    Fill one day early if pharmacy is closed on scheduled refill  . pregabalin (LYRICA) 25 MG capsule    Sig: Take 1 capsule (25 mg total) by mouth 3 (three) times daily.    Dispense:  90 capsule    Refill:  2    Fill one day early if pharmacy is closed on scheduled refill date. May substitute for generic if available.   Orders:  Orders Placed This Encounter  Procedures  . Cervical Epidural Injection    Level(s): C7-T1 Laterality: Right-sided Purpose: Diagnostic/Therapeutic Indication(s): Radiculitis and  cervicalgia associater with cervical degenerative disc disease.    Standing Status:   Future    Standing Expiration Date:   09/30/2018    Scheduling Instructions:     Procedure: Cervical Epidural Steroid Injection/Block     Sedation: With Sedation.     Timeframe: As soon as schedule allows    Order Specific Question:   Where will this procedure be performed?    Answer:   [facility]    Comments:   by Dr. Laban EmperorNaveira   Follow-up plan:   Return for Procedure (w/ sedation): (R) CESI #  2.      Interventional management options:  Considering:   Diagnostic left-sided CESI #2  Diagnostic left trigeminal nerve block  Diagnosticleft greater occipital nerve block  Diagnostic trigger point injections  Diagnostic bilateral cervical facet nerve blocks   Possible bilateral cervical facet RFA    PRN Procedures:   None at this time     Recent Visits Date Type Provider Dept  08/13/18 Procedure visit Milinda Pointer, MD [facility]  07/27/18 Office Visit Milinda Pointer, MD [facility]  07/08/18 Office Visit Milinda Pointer, MD [facility]  06/04/18 Office Visit Milinda Pointer, MD [facility]  Showing recent visits within past 90 days and meeting all other requirements   Today's Visits Date Type Provider Dept  08/31/18 Office Visit Milinda Pointer, MD [facility]  Showing today's visits and meeting all other requirements   Future Appointments No visits were found meeting these conditions.  Showing future appointments within next 90 days and meeting all other requirements   I discussed the assessment and treatment plan with the patient. The patient was provided an opportunity to ask questions and all were answered. The patient agreed with the plan and demonstrated an understanding of the instructions.  Patient advised to call back or seek an in-person evaluation if the symptoms or condition worsens.  Total duration of  non-face-to-face encounter: 15 minutes.  Note by: Gaspar Cola, MD Date: 08/31/2018; Time: 4:52 PM  Note: This dictation was prepared with Dragon dictation. Any transcriptional errors that may result from this process are unintentional.  Disclaimer:  * Given the special circumstances of the COVID-19 pandemic, the federal government has announced that the Office for Civil Rights (OCR) will exercise its enforcement discretion and will not impose penalties on physicians using telehealth in the event of noncompliance with regulatory requirements under the [place] (HIPAA) in connection with the good faith provision of telehealth during the VANVB-16 national public health emergency. ([place])

## 2018-08-31 ENCOUNTER — Other Ambulatory Visit: Payer: Self-pay | Admitting: Pain Medicine

## 2018-08-31 ENCOUNTER — Encounter: Payer: Self-pay | Admitting: Pain Medicine

## 2018-08-31 ENCOUNTER — Other Ambulatory Visit: Payer: Self-pay

## 2018-08-31 ENCOUNTER — Ambulatory Visit: Attending: Pain Medicine | Admitting: Pain Medicine

## 2018-08-31 DIAGNOSIS — M47812 Spondylosis without myelopathy or radiculopathy, cervical region: Secondary | ICD-10-CM | POA: Diagnosis not present

## 2018-08-31 DIAGNOSIS — M8949 Other hypertrophic osteoarthropathy, multiple sites: Secondary | ICD-10-CM

## 2018-08-31 DIAGNOSIS — M150 Primary generalized (osteo)arthritis: Secondary | ICD-10-CM

## 2018-08-31 DIAGNOSIS — M7918 Myalgia, other site: Secondary | ICD-10-CM | POA: Diagnosis not present

## 2018-08-31 DIAGNOSIS — M159 Polyosteoarthritis, unspecified: Secondary | ICD-10-CM

## 2018-08-31 DIAGNOSIS — M542 Cervicalgia: Secondary | ICD-10-CM

## 2018-08-31 DIAGNOSIS — G894 Chronic pain syndrome: Secondary | ICD-10-CM | POA: Diagnosis not present

## 2018-08-31 DIAGNOSIS — M797 Fibromyalgia: Secondary | ICD-10-CM

## 2018-08-31 DIAGNOSIS — G500 Trigeminal neuralgia: Secondary | ICD-10-CM

## 2018-08-31 DIAGNOSIS — G8929 Other chronic pain: Secondary | ICD-10-CM

## 2018-08-31 DIAGNOSIS — G509 Disorder of trigeminal nerve, unspecified: Secondary | ICD-10-CM

## 2018-08-31 MED ORDER — PREGABALIN 25 MG PO CAPS: 25.0000 mg | ORAL_CAPSULE | Freq: Three times a day (TID) | ORAL | 2 refills | Status: DC

## 2018-08-31 MED ORDER — TIZANIDINE HCL 4 MG PO TABS: 4.0000 mg | ORAL_TABLET | Freq: Every day | ORAL | 2 refills | Status: DC

## 2018-08-31 MED ORDER — MELOXICAM 15 MG PO TABS: 15.0000 mg | ORAL_TABLET | Freq: Every day | ORAL | 2 refills | Status: DC

## 2018-08-31 NOTE — Patient Instructions (Signed)

## 2018-09-11 ENCOUNTER — Other Ambulatory Visit: Payer: Self-pay | Admitting: Pain Medicine

## 2018-09-11 DIAGNOSIS — G8929 Other chronic pain: Secondary | ICD-10-CM

## 2018-09-11 DIAGNOSIS — M159 Polyosteoarthritis, unspecified: Secondary | ICD-10-CM

## 2018-09-11 DIAGNOSIS — M8949 Other hypertrophic osteoarthropathy, multiple sites: Secondary | ICD-10-CM

## 2018-09-11 DIAGNOSIS — M47812 Spondylosis without myelopathy or radiculopathy, cervical region: Secondary | ICD-10-CM

## 2018-09-11 DIAGNOSIS — M542 Cervicalgia: Secondary | ICD-10-CM

## 2018-10-14 DIAGNOSIS — Z79899 Other long term (current) drug therapy: Secondary | ICD-10-CM | POA: Diagnosis not present

## 2018-10-14 DIAGNOSIS — F419 Anxiety disorder, unspecified: Secondary | ICD-10-CM | POA: Diagnosis not present

## 2018-10-14 DIAGNOSIS — I10 Essential (primary) hypertension: Secondary | ICD-10-CM | POA: Diagnosis not present

## 2018-10-14 DIAGNOSIS — F1120 Opioid dependence, uncomplicated: Secondary | ICD-10-CM | POA: Diagnosis not present

## 2018-10-14 DIAGNOSIS — G500 Trigeminal neuralgia: Secondary | ICD-10-CM | POA: Diagnosis not present

## 2018-10-14 DIAGNOSIS — R51 Headache: Secondary | ICD-10-CM | POA: Diagnosis not present

## 2018-10-14 DIAGNOSIS — G894 Chronic pain syndrome: Secondary | ICD-10-CM | POA: Diagnosis not present

## 2018-10-14 DIAGNOSIS — M797 Fibromyalgia: Secondary | ICD-10-CM | POA: Diagnosis not present

## 2018-10-14 DIAGNOSIS — G43009 Migraine without aura, not intractable, without status migrainosus: Secondary | ICD-10-CM | POA: Diagnosis not present

## 2018-11-03 ENCOUNTER — Other Ambulatory Visit: Payer: Self-pay | Admitting: Physician Assistant

## 2018-11-03 DIAGNOSIS — E785 Hyperlipidemia, unspecified: Secondary | ICD-10-CM

## 2018-11-03 MED ORDER — PRAVASTATIN SODIUM 80 MG PO TABS: 80.0000 mg | ORAL_TABLET | Freq: Every day | ORAL | 1 refills | Status: DC

## 2018-11-03 NOTE — Telephone Encounter (Signed)
Last OV 02/2018  Thanks,   -Mickel Baas

## 2018-11-03 NOTE — Telephone Encounter (Signed)
[facility] faxed refill request for the following medications: ? ?pravastatin (PRAVACHOL) 80 MG tablet  ? ?Please advise. ? ?

## 2018-12-11 DIAGNOSIS — G43711 Chronic migraine without aura, intractable, with status migrainosus: Secondary | ICD-10-CM | POA: Diagnosis not present

## 2019-01-06 DIAGNOSIS — E785 Hyperlipidemia, unspecified: Secondary | ICD-10-CM | POA: Diagnosis not present

## 2019-01-06 DIAGNOSIS — G43009 Migraine without aura, not intractable, without status migrainosus: Secondary | ICD-10-CM | POA: Diagnosis not present

## 2019-01-06 DIAGNOSIS — F419 Anxiety disorder, unspecified: Secondary | ICD-10-CM | POA: Diagnosis not present

## 2019-01-06 DIAGNOSIS — M797 Fibromyalgia: Secondary | ICD-10-CM | POA: Diagnosis not present

## 2019-01-06 DIAGNOSIS — G43119 Migraine with aura, intractable, without status migrainosus: Secondary | ICD-10-CM | POA: Diagnosis not present

## 2019-01-06 DIAGNOSIS — R519 Headache, unspecified: Secondary | ICD-10-CM | POA: Diagnosis not present

## 2019-01-06 DIAGNOSIS — Z23 Encounter for immunization: Secondary | ICD-10-CM | POA: Diagnosis not present

## 2019-01-06 DIAGNOSIS — G894 Chronic pain syndrome: Secondary | ICD-10-CM | POA: Diagnosis not present

## 2019-01-06 DIAGNOSIS — I10 Essential (primary) hypertension: Secondary | ICD-10-CM | POA: Diagnosis not present

## 2019-01-06 DIAGNOSIS — G500 Trigeminal neuralgia: Secondary | ICD-10-CM | POA: Diagnosis not present

## 2019-01-06 DIAGNOSIS — Z1211 Encounter for screening for malignant neoplasm of colon: Secondary | ICD-10-CM | POA: Diagnosis not present

## 2019-01-18 DIAGNOSIS — E785 Hyperlipidemia, unspecified: Secondary | ICD-10-CM | POA: Diagnosis not present

## 2019-01-18 DIAGNOSIS — Z23 Encounter for immunization: Secondary | ICD-10-CM | POA: Diagnosis not present

## 2019-01-18 DIAGNOSIS — I10 Essential (primary) hypertension: Secondary | ICD-10-CM | POA: Diagnosis not present

## 2019-01-18 DIAGNOSIS — Z1211 Encounter for screening for malignant neoplasm of colon: Secondary | ICD-10-CM | POA: Diagnosis not present

## 2019-01-18 DIAGNOSIS — M797 Fibromyalgia: Secondary | ICD-10-CM | POA: Diagnosis not present

## 2019-01-18 DIAGNOSIS — G43119 Migraine with aura, intractable, without status migrainosus: Secondary | ICD-10-CM | POA: Diagnosis not present

## 2019-02-10 DIAGNOSIS — E785 Hyperlipidemia, unspecified: Secondary | ICD-10-CM | POA: Diagnosis not present

## 2019-02-10 DIAGNOSIS — I10 Essential (primary) hypertension: Secondary | ICD-10-CM | POA: Diagnosis not present

## 2019-03-02 DIAGNOSIS — G43711 Chronic migraine without aura, intractable, with status migrainosus: Secondary | ICD-10-CM | POA: Diagnosis not present

## 2019-03-22 DIAGNOSIS — E785 Hyperlipidemia, unspecified: Secondary | ICD-10-CM | POA: Diagnosis not present

## 2019-03-22 DIAGNOSIS — G43119 Migraine with aura, intractable, without status migrainosus: Secondary | ICD-10-CM | POA: Diagnosis not present

## 2019-03-22 DIAGNOSIS — M797 Fibromyalgia: Secondary | ICD-10-CM | POA: Diagnosis not present

## 2019-03-22 DIAGNOSIS — I10 Essential (primary) hypertension: Secondary | ICD-10-CM | POA: Diagnosis not present

## 2019-05-31 DIAGNOSIS — G43711 Chronic migraine without aura, intractable, with status migrainosus: Secondary | ICD-10-CM | POA: Diagnosis not present

## 2019-07-05 DIAGNOSIS — E785 Hyperlipidemia, unspecified: Secondary | ICD-10-CM | POA: Diagnosis not present

## 2019-07-05 DIAGNOSIS — I10 Essential (primary) hypertension: Secondary | ICD-10-CM | POA: Diagnosis not present

## 2019-07-12 DIAGNOSIS — M609 Myositis, unspecified: Secondary | ICD-10-CM | POA: Diagnosis not present

## 2019-07-12 DIAGNOSIS — I10 Essential (primary) hypertension: Secondary | ICD-10-CM | POA: Diagnosis not present

## 2019-07-12 DIAGNOSIS — M797 Fibromyalgia: Secondary | ICD-10-CM | POA: Diagnosis not present

## 2019-07-12 DIAGNOSIS — G43119 Migraine with aura, intractable, without status migrainosus: Secondary | ICD-10-CM | POA: Diagnosis not present

## 2019-07-12 DIAGNOSIS — E785 Hyperlipidemia, unspecified: Secondary | ICD-10-CM | POA: Diagnosis not present

## 2019-07-15 ENCOUNTER — Other Ambulatory Visit: Payer: Self-pay | Admitting: Obstetrics and Gynecology

## 2019-07-15 DIAGNOSIS — N6310 Unspecified lump in the right breast, unspecified quadrant: Secondary | ICD-10-CM

## 2019-07-15 DIAGNOSIS — Z1231 Encounter for screening mammogram for malignant neoplasm of breast: Secondary | ICD-10-CM | POA: Diagnosis not present

## 2019-07-15 DIAGNOSIS — Z01419 Encounter for gynecological examination (general) (routine) without abnormal findings: Secondary | ICD-10-CM | POA: Diagnosis not present

## 2019-07-19 DIAGNOSIS — M546 Pain in thoracic spine: Secondary | ICD-10-CM | POA: Diagnosis not present

## 2019-07-19 DIAGNOSIS — R519 Headache, unspecified: Secondary | ICD-10-CM | POA: Diagnosis not present

## 2019-07-19 DIAGNOSIS — M9901 Segmental and somatic dysfunction of cervical region: Secondary | ICD-10-CM | POA: Diagnosis not present

## 2019-07-19 DIAGNOSIS — M5432 Sciatica, left side: Secondary | ICD-10-CM | POA: Diagnosis not present

## 2019-07-19 DIAGNOSIS — M9902 Segmental and somatic dysfunction of thoracic region: Secondary | ICD-10-CM | POA: Diagnosis not present

## 2019-07-19 DIAGNOSIS — M9903 Segmental and somatic dysfunction of lumbar region: Secondary | ICD-10-CM | POA: Diagnosis not present

## 2019-07-19 DIAGNOSIS — M62830 Muscle spasm of back: Secondary | ICD-10-CM | POA: Diagnosis not present

## 2019-07-19 DIAGNOSIS — M5431 Sciatica, right side: Secondary | ICD-10-CM | POA: Diagnosis not present

## 2019-07-26 DIAGNOSIS — M9903 Segmental and somatic dysfunction of lumbar region: Secondary | ICD-10-CM | POA: Diagnosis not present

## 2019-07-26 DIAGNOSIS — M9902 Segmental and somatic dysfunction of thoracic region: Secondary | ICD-10-CM | POA: Diagnosis not present

## 2019-07-26 DIAGNOSIS — M546 Pain in thoracic spine: Secondary | ICD-10-CM | POA: Diagnosis not present

## 2019-07-26 DIAGNOSIS — R519 Headache, unspecified: Secondary | ICD-10-CM | POA: Diagnosis not present

## 2019-07-26 DIAGNOSIS — M5432 Sciatica, left side: Secondary | ICD-10-CM | POA: Diagnosis not present

## 2019-07-26 DIAGNOSIS — M62830 Muscle spasm of back: Secondary | ICD-10-CM | POA: Diagnosis not present

## 2019-07-26 DIAGNOSIS — M9901 Segmental and somatic dysfunction of cervical region: Secondary | ICD-10-CM | POA: Diagnosis not present

## 2019-07-26 DIAGNOSIS — M5431 Sciatica, right side: Secondary | ICD-10-CM | POA: Diagnosis not present

## 2019-07-28 ENCOUNTER — Ambulatory Visit
Admission: RE | Admit: 2019-07-28 | Discharge: 2019-07-28 | Disposition: A | Discharge status: Home / Self Care | Payer: Medicare PPO | Source: Ambulatory Visit | Attending: Obstetrics and Gynecology | Admitting: Obstetrics and Gynecology

## 2019-07-28 DIAGNOSIS — N6313 Unspecified lump in the right breast, lower outer quadrant: Secondary | ICD-10-CM | POA: Diagnosis not present

## 2019-07-28 DIAGNOSIS — N6310 Unspecified lump in the right breast, unspecified quadrant: Secondary | ICD-10-CM | POA: Diagnosis present

## 2019-07-28 DIAGNOSIS — M9902 Segmental and somatic dysfunction of thoracic region: Secondary | ICD-10-CM | POA: Diagnosis not present

## 2019-07-28 DIAGNOSIS — M9903 Segmental and somatic dysfunction of lumbar region: Secondary | ICD-10-CM | POA: Diagnosis not present

## 2019-07-28 DIAGNOSIS — M9901 Segmental and somatic dysfunction of cervical region: Secondary | ICD-10-CM | POA: Diagnosis not present

## 2019-07-28 DIAGNOSIS — M62830 Muscle spasm of back: Secondary | ICD-10-CM | POA: Diagnosis not present

## 2019-07-28 DIAGNOSIS — M546 Pain in thoracic spine: Secondary | ICD-10-CM | POA: Diagnosis not present

## 2019-07-28 DIAGNOSIS — N644 Mastodynia: Secondary | ICD-10-CM | POA: Insufficient documentation

## 2019-07-28 DIAGNOSIS — R922 Inconclusive mammogram: Secondary | ICD-10-CM | POA: Diagnosis not present

## 2019-07-28 DIAGNOSIS — R519 Headache, unspecified: Secondary | ICD-10-CM | POA: Diagnosis not present

## 2019-07-28 DIAGNOSIS — M5431 Sciatica, right side: Secondary | ICD-10-CM | POA: Diagnosis not present

## 2019-07-28 DIAGNOSIS — M5432 Sciatica, left side: Secondary | ICD-10-CM | POA: Diagnosis not present

## 2019-08-04 DIAGNOSIS — R519 Headache, unspecified: Secondary | ICD-10-CM | POA: Diagnosis not present

## 2019-08-04 DIAGNOSIS — M5432 Sciatica, left side: Secondary | ICD-10-CM | POA: Diagnosis not present

## 2019-08-04 DIAGNOSIS — M9902 Segmental and somatic dysfunction of thoracic region: Secondary | ICD-10-CM | POA: Diagnosis not present

## 2019-08-04 DIAGNOSIS — M9901 Segmental and somatic dysfunction of cervical region: Secondary | ICD-10-CM | POA: Diagnosis not present

## 2019-08-04 DIAGNOSIS — M62830 Muscle spasm of back: Secondary | ICD-10-CM | POA: Diagnosis not present

## 2019-08-04 DIAGNOSIS — M9903 Segmental and somatic dysfunction of lumbar region: Secondary | ICD-10-CM | POA: Diagnosis not present

## 2019-08-04 DIAGNOSIS — M5431 Sciatica, right side: Secondary | ICD-10-CM | POA: Diagnosis not present

## 2019-08-04 DIAGNOSIS — M546 Pain in thoracic spine: Secondary | ICD-10-CM | POA: Diagnosis not present

## 2019-08-09 DIAGNOSIS — M9901 Segmental and somatic dysfunction of cervical region: Secondary | ICD-10-CM | POA: Diagnosis not present

## 2019-08-09 DIAGNOSIS — M9903 Segmental and somatic dysfunction of lumbar region: Secondary | ICD-10-CM | POA: Diagnosis not present

## 2019-08-09 DIAGNOSIS — M5432 Sciatica, left side: Secondary | ICD-10-CM | POA: Diagnosis not present

## 2019-08-09 DIAGNOSIS — R519 Headache, unspecified: Secondary | ICD-10-CM | POA: Diagnosis not present

## 2019-08-18 DIAGNOSIS — M9901 Segmental and somatic dysfunction of cervical region: Secondary | ICD-10-CM | POA: Diagnosis not present

## 2019-08-18 DIAGNOSIS — M9903 Segmental and somatic dysfunction of lumbar region: Secondary | ICD-10-CM | POA: Diagnosis not present

## 2019-08-18 DIAGNOSIS — M5432 Sciatica, left side: Secondary | ICD-10-CM | POA: Diagnosis not present

## 2019-08-18 DIAGNOSIS — R519 Headache, unspecified: Secondary | ICD-10-CM | POA: Diagnosis not present

## 2020-06-28 DIAGNOSIS — M9901 Segmental and somatic dysfunction of cervical region: Secondary | ICD-10-CM | POA: Diagnosis not present

## 2020-06-28 DIAGNOSIS — R519 Headache, unspecified: Secondary | ICD-10-CM | POA: Diagnosis not present

## 2020-06-28 DIAGNOSIS — M5432 Sciatica, left side: Secondary | ICD-10-CM | POA: Diagnosis not present

## 2020-06-28 DIAGNOSIS — M9903 Segmental and somatic dysfunction of lumbar region: Secondary | ICD-10-CM | POA: Diagnosis not present

## 2020-07-03 DIAGNOSIS — R519 Headache, unspecified: Secondary | ICD-10-CM | POA: Diagnosis not present

## 2020-07-03 DIAGNOSIS — M9903 Segmental and somatic dysfunction of lumbar region: Secondary | ICD-10-CM | POA: Diagnosis not present

## 2020-07-03 DIAGNOSIS — M9901 Segmental and somatic dysfunction of cervical region: Secondary | ICD-10-CM | POA: Diagnosis not present

## 2020-07-03 DIAGNOSIS — M5432 Sciatica, left side: Secondary | ICD-10-CM | POA: Diagnosis not present

## 2020-07-10 DIAGNOSIS — M5432 Sciatica, left side: Secondary | ICD-10-CM | POA: Diagnosis not present

## 2020-07-10 DIAGNOSIS — M9901 Segmental and somatic dysfunction of cervical region: Secondary | ICD-10-CM | POA: Diagnosis not present

## 2020-07-10 DIAGNOSIS — R519 Headache, unspecified: Secondary | ICD-10-CM | POA: Diagnosis not present

## 2020-07-10 DIAGNOSIS — M9903 Segmental and somatic dysfunction of lumbar region: Secondary | ICD-10-CM | POA: Diagnosis not present

## 2020-07-17 ENCOUNTER — Other Ambulatory Visit: Payer: Self-pay | Admitting: Internal Medicine

## 2020-07-17 DIAGNOSIS — R519 Headache, unspecified: Secondary | ICD-10-CM | POA: Diagnosis not present

## 2020-07-17 DIAGNOSIS — M9903 Segmental and somatic dysfunction of lumbar region: Secondary | ICD-10-CM | POA: Diagnosis not present

## 2020-07-17 DIAGNOSIS — M9901 Segmental and somatic dysfunction of cervical region: Secondary | ICD-10-CM | POA: Diagnosis not present

## 2020-07-17 DIAGNOSIS — M5432 Sciatica, left side: Secondary | ICD-10-CM | POA: Diagnosis not present

## 2020-07-18 DIAGNOSIS — F331 Major depressive disorder, recurrent, moderate: Secondary | ICD-10-CM | POA: Diagnosis not present

## 2020-07-18 DIAGNOSIS — M797 Fibromyalgia: Secondary | ICD-10-CM | POA: Diagnosis not present

## 2020-07-18 DIAGNOSIS — G43119 Migraine with aura, intractable, without status migrainosus: Secondary | ICD-10-CM | POA: Diagnosis not present

## 2020-07-18 DIAGNOSIS — N644 Mastodynia: Secondary | ICD-10-CM | POA: Diagnosis not present

## 2020-07-18 DIAGNOSIS — E785 Hyperlipidemia, unspecified: Secondary | ICD-10-CM | POA: Diagnosis not present

## 2020-07-18 DIAGNOSIS — I10 Essential (primary) hypertension: Secondary | ICD-10-CM | POA: Diagnosis not present

## 2020-07-18 DIAGNOSIS — M609 Myositis, unspecified: Secondary | ICD-10-CM | POA: Diagnosis not present

## 2020-07-26 DIAGNOSIS — M9903 Segmental and somatic dysfunction of lumbar region: Secondary | ICD-10-CM | POA: Diagnosis not present

## 2020-07-26 DIAGNOSIS — M9901 Segmental and somatic dysfunction of cervical region: Secondary | ICD-10-CM | POA: Diagnosis not present

## 2020-07-26 DIAGNOSIS — M5432 Sciatica, left side: Secondary | ICD-10-CM | POA: Diagnosis not present

## 2020-07-26 DIAGNOSIS — R519 Headache, unspecified: Secondary | ICD-10-CM | POA: Diagnosis not present

## 2020-08-07 ENCOUNTER — Other Ambulatory Visit: Payer: Self-pay | Admitting: Internal Medicine

## 2020-08-07 DIAGNOSIS — Z1231 Encounter for screening mammogram for malignant neoplasm of breast: Secondary | ICD-10-CM

## 2020-08-15 DIAGNOSIS — G43711 Chronic migraine without aura, intractable, with status migrainosus: Secondary | ICD-10-CM | POA: Diagnosis not present

## 2020-08-16 ENCOUNTER — Ambulatory Visit
Admission: RE | Admit: 2020-08-16 | Discharge: 2020-08-16 | Disposition: A | Discharge status: Home / Self Care | Payer: Medicare PPO | Source: Ambulatory Visit | Attending: Internal Medicine | Admitting: Internal Medicine

## 2020-08-16 ENCOUNTER — Other Ambulatory Visit: Payer: Self-pay

## 2020-08-16 DIAGNOSIS — Z1231 Encounter for screening mammogram for malignant neoplasm of breast: Secondary | ICD-10-CM | POA: Diagnosis not present

## 2020-08-19 DIAGNOSIS — I10 Essential (primary) hypertension: Secondary | ICD-10-CM | POA: Diagnosis not present

## 2020-08-19 DIAGNOSIS — E663 Overweight: Secondary | ICD-10-CM | POA: Diagnosis not present

## 2020-08-19 DIAGNOSIS — F419 Anxiety disorder, unspecified: Secondary | ICD-10-CM | POA: Diagnosis not present

## 2020-08-19 DIAGNOSIS — J309 Allergic rhinitis, unspecified: Secondary | ICD-10-CM | POA: Diagnosis not present

## 2020-08-19 DIAGNOSIS — E785 Hyperlipidemia, unspecified: Secondary | ICD-10-CM | POA: Diagnosis not present

## 2020-08-19 DIAGNOSIS — G43909 Migraine, unspecified, not intractable, without status migrainosus: Secondary | ICD-10-CM | POA: Diagnosis not present

## 2020-08-19 DIAGNOSIS — G8929 Other chronic pain: Secondary | ICD-10-CM | POA: Diagnosis not present

## 2020-08-19 DIAGNOSIS — M1990 Unspecified osteoarthritis, unspecified site: Secondary | ICD-10-CM | POA: Diagnosis not present

## 2020-08-19 DIAGNOSIS — F329 Major depressive disorder, single episode, unspecified: Secondary | ICD-10-CM | POA: Diagnosis not present

## 2020-10-27 DIAGNOSIS — M5432 Sciatica, left side: Secondary | ICD-10-CM | POA: Diagnosis not present

## 2020-10-27 DIAGNOSIS — R519 Headache, unspecified: Secondary | ICD-10-CM | POA: Diagnosis not present

## 2020-10-27 DIAGNOSIS — M9901 Segmental and somatic dysfunction of cervical region: Secondary | ICD-10-CM | POA: Diagnosis not present

## 2020-10-27 DIAGNOSIS — M9903 Segmental and somatic dysfunction of lumbar region: Secondary | ICD-10-CM | POA: Diagnosis not present

## 2020-10-30 DIAGNOSIS — M5432 Sciatica, left side: Secondary | ICD-10-CM | POA: Diagnosis not present

## 2020-10-30 DIAGNOSIS — M9901 Segmental and somatic dysfunction of cervical region: Secondary | ICD-10-CM | POA: Diagnosis not present

## 2020-10-30 DIAGNOSIS — R519 Headache, unspecified: Secondary | ICD-10-CM | POA: Diagnosis not present

## 2020-10-30 DIAGNOSIS — M9903 Segmental and somatic dysfunction of lumbar region: Secondary | ICD-10-CM | POA: Diagnosis not present

## 2020-11-03 DIAGNOSIS — R519 Headache, unspecified: Secondary | ICD-10-CM | POA: Diagnosis not present

## 2020-11-03 DIAGNOSIS — M9903 Segmental and somatic dysfunction of lumbar region: Secondary | ICD-10-CM | POA: Diagnosis not present

## 2020-11-03 DIAGNOSIS — M5432 Sciatica, left side: Secondary | ICD-10-CM | POA: Diagnosis not present

## 2020-11-03 DIAGNOSIS — M9901 Segmental and somatic dysfunction of cervical region: Secondary | ICD-10-CM | POA: Diagnosis not present

## 2020-11-06 DIAGNOSIS — M5432 Sciatica, left side: Secondary | ICD-10-CM | POA: Diagnosis not present

## 2020-11-06 DIAGNOSIS — M9901 Segmental and somatic dysfunction of cervical region: Secondary | ICD-10-CM | POA: Diagnosis not present

## 2020-11-06 DIAGNOSIS — M9903 Segmental and somatic dysfunction of lumbar region: Secondary | ICD-10-CM | POA: Diagnosis not present

## 2020-11-06 DIAGNOSIS — R519 Headache, unspecified: Secondary | ICD-10-CM | POA: Diagnosis not present

## 2020-11-08 DIAGNOSIS — F40298 Other specified phobia: Secondary | ICD-10-CM | POA: Diagnosis not present

## 2020-11-08 DIAGNOSIS — F32A Depression, unspecified: Secondary | ICD-10-CM | POA: Diagnosis not present

## 2020-11-08 DIAGNOSIS — I10 Essential (primary) hypertension: Secondary | ICD-10-CM | POA: Diagnosis not present

## 2020-11-08 DIAGNOSIS — G43711 Chronic migraine without aura, intractable, with status migrainosus: Secondary | ICD-10-CM | POA: Diagnosis not present

## 2020-11-08 DIAGNOSIS — E7801 Familial hypercholesterolemia: Secondary | ICD-10-CM | POA: Diagnosis not present

## 2020-11-13 DIAGNOSIS — R519 Headache, unspecified: Secondary | ICD-10-CM | POA: Diagnosis not present

## 2020-11-13 DIAGNOSIS — M9903 Segmental and somatic dysfunction of lumbar region: Secondary | ICD-10-CM | POA: Diagnosis not present

## 2020-11-13 DIAGNOSIS — M5432 Sciatica, left side: Secondary | ICD-10-CM | POA: Diagnosis not present

## 2020-11-13 DIAGNOSIS — M9901 Segmental and somatic dysfunction of cervical region: Secondary | ICD-10-CM | POA: Diagnosis not present

## 2020-11-22 DIAGNOSIS — Z01419 Encounter for gynecological examination (general) (routine) without abnormal findings: Secondary | ICD-10-CM | POA: Diagnosis not present

## 2020-11-27 DIAGNOSIS — G8929 Other chronic pain: Secondary | ICD-10-CM | POA: Diagnosis not present

## 2020-11-27 DIAGNOSIS — M797 Fibromyalgia: Secondary | ICD-10-CM | POA: Diagnosis not present

## 2020-11-27 DIAGNOSIS — M545 Low back pain, unspecified: Secondary | ICD-10-CM | POA: Diagnosis not present

## 2020-11-27 DIAGNOSIS — M542 Cervicalgia: Secondary | ICD-10-CM | POA: Diagnosis not present

## 2020-11-27 DIAGNOSIS — R197 Diarrhea, unspecified: Secondary | ICD-10-CM | POA: Diagnosis not present

## 2020-11-27 DIAGNOSIS — Z760 Encounter for issue of repeat prescription: Secondary | ICD-10-CM | POA: Diagnosis not present

## 2020-11-27 DIAGNOSIS — I10 Essential (primary) hypertension: Secondary | ICD-10-CM | POA: Diagnosis not present

## 2020-11-27 DIAGNOSIS — K5900 Constipation, unspecified: Secondary | ICD-10-CM | POA: Diagnosis not present

## 2020-11-29 DIAGNOSIS — M5432 Sciatica, left side: Secondary | ICD-10-CM | POA: Diagnosis not present

## 2020-11-29 DIAGNOSIS — M9901 Segmental and somatic dysfunction of cervical region: Secondary | ICD-10-CM | POA: Diagnosis not present

## 2020-11-29 DIAGNOSIS — M9903 Segmental and somatic dysfunction of lumbar region: Secondary | ICD-10-CM | POA: Diagnosis not present

## 2020-11-29 DIAGNOSIS — R519 Headache, unspecified: Secondary | ICD-10-CM | POA: Diagnosis not present

## 2020-12-01 DIAGNOSIS — M9901 Segmental and somatic dysfunction of cervical region: Secondary | ICD-10-CM | POA: Diagnosis not present

## 2020-12-01 DIAGNOSIS — M9903 Segmental and somatic dysfunction of lumbar region: Secondary | ICD-10-CM | POA: Diagnosis not present

## 2020-12-01 DIAGNOSIS — M5432 Sciatica, left side: Secondary | ICD-10-CM | POA: Diagnosis not present

## 2020-12-01 DIAGNOSIS — R519 Headache, unspecified: Secondary | ICD-10-CM | POA: Diagnosis not present

## 2020-12-06 DIAGNOSIS — G43711 Chronic migraine without aura, intractable, with status migrainosus: Secondary | ICD-10-CM | POA: Diagnosis not present

## 2020-12-11 DIAGNOSIS — M9903 Segmental and somatic dysfunction of lumbar region: Secondary | ICD-10-CM | POA: Diagnosis not present

## 2020-12-11 DIAGNOSIS — M9901 Segmental and somatic dysfunction of cervical region: Secondary | ICD-10-CM | POA: Diagnosis not present

## 2020-12-11 DIAGNOSIS — R519 Headache, unspecified: Secondary | ICD-10-CM | POA: Diagnosis not present

## 2020-12-11 DIAGNOSIS — M5432 Sciatica, left side: Secondary | ICD-10-CM | POA: Diagnosis not present

## 2020-12-29 DIAGNOSIS — M797 Fibromyalgia: Secondary | ICD-10-CM | POA: Diagnosis not present

## 2020-12-29 DIAGNOSIS — I10 Essential (primary) hypertension: Secondary | ICD-10-CM | POA: Diagnosis not present

## 2020-12-29 DIAGNOSIS — J309 Allergic rhinitis, unspecified: Secondary | ICD-10-CM | POA: Diagnosis not present

## 2020-12-29 DIAGNOSIS — G43711 Chronic migraine without aura, intractable, with status migrainosus: Secondary | ICD-10-CM | POA: Diagnosis not present

## 2020-12-29 DIAGNOSIS — G500 Trigeminal neuralgia: Secondary | ICD-10-CM | POA: Diagnosis not present

## 2020-12-29 DIAGNOSIS — E782 Mixed hyperlipidemia: Secondary | ICD-10-CM | POA: Diagnosis not present

## 2020-12-29 DIAGNOSIS — R7303 Prediabetes: Secondary | ICD-10-CM | POA: Diagnosis not present

## 2021-01-23 DIAGNOSIS — R748 Abnormal levels of other serum enzymes: Secondary | ICD-10-CM | POA: Diagnosis not present

## 2021-01-24 ENCOUNTER — Other Ambulatory Visit: Payer: Self-pay | Admitting: Student

## 2021-01-24 DIAGNOSIS — R748 Abnormal levels of other serum enzymes: Secondary | ICD-10-CM

## 2021-01-31 ENCOUNTER — Ambulatory Visit: Payer: Medicare PPO

## 2021-02-06 DIAGNOSIS — M797 Fibromyalgia: Secondary | ICD-10-CM | POA: Diagnosis not present

## 2021-02-06 DIAGNOSIS — G43719 Chronic migraine without aura, intractable, without status migrainosus: Secondary | ICD-10-CM | POA: Diagnosis not present

## 2021-02-06 DIAGNOSIS — I10 Essential (primary) hypertension: Secondary | ICD-10-CM | POA: Diagnosis not present

## 2021-02-06 DIAGNOSIS — G43711 Chronic migraine without aura, intractable, with status migrainosus: Secondary | ICD-10-CM | POA: Diagnosis not present

## 2021-02-06 DIAGNOSIS — G500 Trigeminal neuralgia: Secondary | ICD-10-CM | POA: Diagnosis not present

## 2021-02-07 ENCOUNTER — Other Ambulatory Visit: Payer: Self-pay

## 2021-02-07 ENCOUNTER — Ambulatory Visit
Admission: RE | Admit: 2021-02-07 | Discharge: 2021-02-07 | Disposition: A | Discharge status: Home / Self Care | Payer: Medicare PPO | Source: Ambulatory Visit | Attending: Student | Admitting: Student

## 2021-02-07 DIAGNOSIS — R748 Abnormal levels of other serum enzymes: Secondary | ICD-10-CM | POA: Diagnosis not present

## 2021-02-07 DIAGNOSIS — K760 Fatty (change of) liver, not elsewhere classified: Secondary | ICD-10-CM | POA: Diagnosis not present

## 2021-03-07 DIAGNOSIS — G43711 Chronic migraine without aura, intractable, with status migrainosus: Secondary | ICD-10-CM | POA: Diagnosis not present

## 2021-05-17 DIAGNOSIS — G43719 Chronic migraine without aura, intractable, without status migrainosus: Secondary | ICD-10-CM | POA: Diagnosis not present

## 2021-05-17 DIAGNOSIS — G500 Trigeminal neuralgia: Secondary | ICD-10-CM | POA: Diagnosis not present

## 2021-05-17 DIAGNOSIS — R7401 Elevation of levels of liver transaminase levels: Secondary | ICD-10-CM | POA: Diagnosis not present

## 2021-05-17 DIAGNOSIS — G43711 Chronic migraine without aura, intractable, with status migrainosus: Secondary | ICD-10-CM | POA: Diagnosis not present

## 2021-05-28 DIAGNOSIS — M9901 Segmental and somatic dysfunction of cervical region: Secondary | ICD-10-CM | POA: Diagnosis not present

## 2021-05-28 DIAGNOSIS — R519 Headache, unspecified: Secondary | ICD-10-CM | POA: Diagnosis not present

## 2021-05-28 DIAGNOSIS — M5432 Sciatica, left side: Secondary | ICD-10-CM | POA: Diagnosis not present

## 2021-05-28 DIAGNOSIS — M9903 Segmental and somatic dysfunction of lumbar region: Secondary | ICD-10-CM | POA: Diagnosis not present

## 2021-06-05 DIAGNOSIS — G43719 Chronic migraine without aura, intractable, without status migrainosus: Secondary | ICD-10-CM | POA: Diagnosis not present

## 2021-08-22 ENCOUNTER — Other Ambulatory Visit: Payer: Self-pay | Admitting: Student

## 2021-08-22 DIAGNOSIS — Z1231 Encounter for screening mammogram for malignant neoplasm of breast: Secondary | ICD-10-CM

## 2021-08-28 DIAGNOSIS — I10 Essential (primary) hypertension: Secondary | ICD-10-CM | POA: Diagnosis not present

## 2021-08-28 DIAGNOSIS — G43719 Chronic migraine without aura, intractable, without status migrainosus: Secondary | ICD-10-CM | POA: Diagnosis not present

## 2021-08-28 DIAGNOSIS — M797 Fibromyalgia: Secondary | ICD-10-CM | POA: Diagnosis not present

## 2021-08-28 DIAGNOSIS — M542 Cervicalgia: Secondary | ICD-10-CM | POA: Diagnosis not present

## 2021-08-28 DIAGNOSIS — G501 Atypical facial pain: Secondary | ICD-10-CM | POA: Diagnosis not present

## 2021-09-18 ENCOUNTER — Ambulatory Visit
Admission: RE | Admit: 2021-09-18 | Discharge: 2021-09-18 | Disposition: A | Discharge status: Home / Self Care | Payer: Medicare PPO | Source: Ambulatory Visit | Attending: Student | Admitting: Student

## 2021-09-18 DIAGNOSIS — Z1231 Encounter for screening mammogram for malignant neoplasm of breast: Secondary | ICD-10-CM | POA: Insufficient documentation

## 2021-09-26 DIAGNOSIS — G43711 Chronic migraine without aura, intractable, with status migrainosus: Secondary | ICD-10-CM | POA: Diagnosis not present

## 2021-09-28 DIAGNOSIS — J209 Acute bronchitis, unspecified: Secondary | ICD-10-CM | POA: Diagnosis not present

## 2021-09-28 DIAGNOSIS — R059 Cough, unspecified: Secondary | ICD-10-CM | POA: Diagnosis not present

## 2021-11-01 DIAGNOSIS — G500 Trigeminal neuralgia: Secondary | ICD-10-CM | POA: Diagnosis not present

## 2021-11-01 DIAGNOSIS — R7303 Prediabetes: Secondary | ICD-10-CM | POA: Diagnosis not present

## 2021-11-01 DIAGNOSIS — Z0000 Encounter for general adult medical examination without abnormal findings: Secondary | ICD-10-CM | POA: Diagnosis not present

## 2021-11-01 DIAGNOSIS — Z1329 Encounter for screening for other suspected endocrine disorder: Secondary | ICD-10-CM | POA: Diagnosis not present

## 2021-11-01 DIAGNOSIS — G43711 Chronic migraine without aura, intractable, with status migrainosus: Secondary | ICD-10-CM | POA: Diagnosis not present

## 2021-11-01 DIAGNOSIS — Z23 Encounter for immunization: Secondary | ICD-10-CM | POA: Diagnosis not present

## 2021-11-01 DIAGNOSIS — E782 Mixed hyperlipidemia: Secondary | ICD-10-CM | POA: Diagnosis not present

## 2021-11-01 DIAGNOSIS — I10 Essential (primary) hypertension: Secondary | ICD-10-CM | POA: Diagnosis not present

## 2021-11-01 DIAGNOSIS — F419 Anxiety disorder, unspecified: Secondary | ICD-10-CM | POA: Diagnosis not present

## 2021-11-08 ENCOUNTER — Telehealth: Payer: Self-pay

## 2021-11-08 NOTE — Patient Outreach (Signed)
  Care Coordination   11/08/2021 Name: Linda Gomez MRN: [id number] DOB: 20-Oct-1965   Care Coordination Outreach Attempts:  An unsuccessful telephone outreach was attempted today to offer the patient information about available care coordination services as a benefit of their health plan.   Follow Up Plan:  Additional outreach attempts will be made to offer the patient care coordination information and services.   Encounter Outcome:  No Answer  Care Coordination Interventions Activated:  No   Care Coordination Interventions:  No, not indicated    Noreene Larsson RN, MSN, [facility]  Mobile: [phone]

## 2021-11-19 DIAGNOSIS — J029 Acute pharyngitis, unspecified: Secondary | ICD-10-CM | POA: Diagnosis not present

## 2021-12-05 DIAGNOSIS — R748 Abnormal levels of other serum enzymes: Secondary | ICD-10-CM | POA: Diagnosis not present

## 2021-12-05 DIAGNOSIS — R7303 Prediabetes: Secondary | ICD-10-CM | POA: Diagnosis not present

## 2021-12-05 DIAGNOSIS — I10 Essential (primary) hypertension: Secondary | ICD-10-CM | POA: Diagnosis not present

## 2021-12-05 DIAGNOSIS — E876 Hypokalemia: Secondary | ICD-10-CM | POA: Diagnosis not present

## 2021-12-05 DIAGNOSIS — G8929 Other chronic pain: Secondary | ICD-10-CM | POA: Diagnosis not present

## 2021-12-05 DIAGNOSIS — G43909 Migraine, unspecified, not intractable, without status migrainosus: Secondary | ICD-10-CM | POA: Diagnosis not present

## 2021-12-05 DIAGNOSIS — M797 Fibromyalgia: Secondary | ICD-10-CM | POA: Diagnosis not present

## 2021-12-11 DIAGNOSIS — I10 Essential (primary) hypertension: Secondary | ICD-10-CM | POA: Diagnosis not present

## 2021-12-11 DIAGNOSIS — M542 Cervicalgia: Secondary | ICD-10-CM | POA: Diagnosis not present

## 2021-12-11 DIAGNOSIS — M797 Fibromyalgia: Secondary | ICD-10-CM | POA: Diagnosis not present

## 2021-12-11 DIAGNOSIS — G43719 Chronic migraine without aura, intractable, without status migrainosus: Secondary | ICD-10-CM | POA: Diagnosis not present

## 2021-12-11 DIAGNOSIS — R748 Abnormal levels of other serum enzymes: Secondary | ICD-10-CM | POA: Diagnosis not present

## 2021-12-11 DIAGNOSIS — G501 Atypical facial pain: Secondary | ICD-10-CM | POA: Diagnosis not present

## 2021-12-13 DIAGNOSIS — E876 Hypokalemia: Secondary | ICD-10-CM | POA: Diagnosis not present

## 2022-02-18 DIAGNOSIS — R000 Tachycardia, unspecified: Secondary | ICD-10-CM | POA: Diagnosis not present

## 2022-02-27 ENCOUNTER — Ambulatory Visit: Payer: Medicare PPO | Admitting: Internal Medicine

## 2022-02-27 ENCOUNTER — Encounter: Payer: Self-pay | Admitting: Internal Medicine

## 2022-02-27 VITALS — BP 109/75 | HR 94 | Ht 63.0 in | Wt 148.4 lb

## 2022-02-27 DIAGNOSIS — I10 Essential (primary) hypertension: Secondary | ICD-10-CM

## 2022-02-27 DIAGNOSIS — R9431 Abnormal electrocardiogram [ECG] [EKG]: Secondary | ICD-10-CM

## 2022-02-27 DIAGNOSIS — E782 Mixed hyperlipidemia: Secondary | ICD-10-CM | POA: Diagnosis not present

## 2022-02-27 DIAGNOSIS — R000 Tachycardia, unspecified: Secondary | ICD-10-CM | POA: Diagnosis not present

## 2022-02-27 MED ORDER — PROPRANOLOL HCL 10 MG PO TABS: 10.0000 mg | ORAL_TABLET | Freq: Three times a day (TID) | ORAL | 6 refills | Status: DC | PRN

## 2022-02-27 NOTE — Progress Notes (Unsigned)
Primary Physician/Referring:  Traci Sermon, PA-C  Patient ID: Linda Gomez, female    DOB: [birth year], 57 y.o.   MRN: [id number]  Chief Complaint  Patient presents with   Tachycardia   New Patient (Initial Visit)   HPI:    Linda Gomez  is a 57 y.o. female with past medical history significant for hypertension and hyperlipidemia who is here to establish care with cardiology due to tachycardia and palpitations.  Patient states that this has just been occurring randomly lately and it is starting to bother her.  She is very worried that she gets heart rate alerts on her watch when she is just sitting and resting.  She has also been woken up in the middle of the night when her watch was alarming her that her heart rate was quite high.  Patient has not had anything like this in the past.  She denies chest pain, shortness of breath, diaphoresis, syncope, edema, orthopnea, PND.  She has not tried any as needed medications to help with this.  Patient does not smoke or drink alcohol.  Past Medical History:  Diagnosis Date   Allergy    Anxiety    Arthritis    Bilateral myofascial pain    Depression    Fibromyalgia    GERD (gastroesophageal reflux disease)    Hyperlipidemia    Hypertension    Neuromuscular disorder (HCC)    Restless leg syndrome    Trigeminal neuralgia    Trigeminal neuralgia    Trigeminal neuralgia    Trigeminal neuropathy    Past Surgical History:  Procedure Laterality Date   BRAIN SURGERY     CESAREAN SECTION     Family History  Problem Relation Age of Onset   Hyperlipidemia Mother    Hypertension Mother    Diabetes Mother    Cancer Mother    Lung cancer Father    Diabetes Father     Social History   Tobacco Use   Smoking status: Never   Smokeless tobacco: Never  Substance Use Topics   Alcohol use: Yes    Alcohol/week: 1.0 standard drink of alcohol    Types: 1 Glasses of wine per week   Marital Status: Married  ROS  Review of Systems   Cardiovascular:  Positive for irregular heartbeat and palpitations.   Objective  Blood pressure 109/75, pulse 94, height 5\' 3"  (1.6 m), weight 148 lb 6.4 oz (67.3 kg), SpO2 98 %. Body mass index is 26.29 kg/m.     02/27/2022   10:39 AM 08/13/2018   11:48 AM 08/13/2018   11:38 AM  Vitals with BMI  Height 5\' 3"     Weight 148 lbs 6 oz    BMI 35.00    Systolic 938 182 993  Diastolic 75 81 69  Pulse 94       Physical Exam Vitals reviewed.  HENT:     Head: Normocephalic and atraumatic.  Cardiovascular:     Rate and Rhythm: Normal rate and regular rhythm.     Pulses: Normal pulses.     Heart sounds: Normal heart sounds. No murmur heard. Pulmonary:     Effort: Pulmonary effort is normal.     Breath sounds: Normal breath sounds.  Abdominal:     General: Bowel sounds are normal.  Musculoskeletal:     Right lower leg: No edema.     Left lower leg: No edema.  Skin:    General: Skin is warm and dry.  Neurological:  Mental Status: She is alert.     Medications and allergies  No Known Allergies   Medication list after today's encounter   Current Outpatient Medications:    Black Pepper-Turmeric (TURMERIC COMPLEX/BLACK PEPPER PO), Take by mouth 2 (two) times a day., Disp: , Rfl:    Cholecalciferol (D 1000) 25 MCG (1000 UT) CHEW, Chew 1,000 Units by mouth daily., Disp: , Rfl:    DULoxetine (CYMBALTA) 30 MG capsule, Take 30 mg by mouth daily., Disp: , Rfl:    fexofenadine (ALLEGRA) 180 MG tablet, Take 180 mg by mouth daily., Disp: , Rfl:    fluticasone (FLONASE) 50 MCG/ACT nasal spray, Place into the nose., Disp: , Rfl:    hydrochlorothiazide (HYDRODIURIL) 12.5 MG tablet, Take 12.5 mg by mouth daily., Disp: , Rfl:    magnesium gluconate (MAGONATE) 500 MG tablet, Take 500 mg by mouth 2 (two) times daily., Disp: , Rfl:    methocarbamol (ROBAXIN) 750 MG tablet, Take 750 mg by mouth in the morning and at bedtime., Disp: , Rfl:    Multiple Vitamin (MULTIVITAMIN) tablet, Take 1 tablet  by mouth daily., Disp: , Rfl:    omeprazole (PRILOSEC) 20 MG capsule, Take 20 mg by mouth daily as needed. , Disp: , Rfl:    Potassium Chloride ER 20 MEQ TBCR, Take 1 tablet by mouth 2 (two) times daily., Disp: , Rfl:    propranolol (INDERAL) 10 MG tablet, Take 1 tablet (10 mg total) by mouth 3 (three) times daily as needed., Disp: 90 tablet, Rfl: 6   rizatriptan (MAXALT) 10 MG tablet, Take 10 mg by mouth as needed., Disp: , Rfl:    rosuvastatin (CRESTOR) 40 MG tablet, Take 40 mg by mouth daily., Disp: , Rfl:    topiramate (TOPAMAX) 200 MG tablet, Take 200 mg by mouth daily., Disp: , Rfl:    vitamin C (ASCORBIC ACID) 250 MG tablet, Take 250 mg by mouth daily., Disp: , Rfl:    Vitamin E 45 MG (100 UNIT) CAPS, Take by mouth., Disp: , Rfl:    Fremanezumab-vfrm 225 MG/1.5ML SOSY, Inject 225 mg into the skin every 28 (twenty-eight) days., Disp: , Rfl:   Laboratory examination:   Lab Results  Component Value Date   NA 141 04/15/2018   K 3.6 04/15/2018   CO2 21 01/06/2018   GLUCOSE 90 04/15/2018   BUN 6 04/15/2018   CREATININE 0.91 04/15/2018   CALCIUM 9.4 04/15/2018   GFRNONAA 72 04/15/2018       Latest Ref Rng & Units 04/15/2018   11:15 AM 01/06/2018    2:59 PM  CMP  Glucose 65 - 99 mg/dL 90  88   BUN 6 - 24 mg/dL 6  10   Creatinine 0.57 - 1.00 mg/dL 0.91  0.89   Sodium 134 - 144 mmol/L 141  139   Potassium 3.5 - 5.2 mmol/L 3.6  4.1   Chloride 96 - 106 mmol/L 102  105   CO2 20 - 29 mmol/L  21   Calcium 8.7 - 10.2 mg/dL 9.4  9.2   Total Protein 6.0 - 8.5 g/dL 7.3  6.7   Total Bilirubin 0.0 - 1.2 mg/dL 0.2  <0.2   Alkaline Phos 39 - 117 IU/L 125  133   AST 0 - 40 IU/L 35  16   ALT 0 - 32 IU/L  25       Latest Ref Rng & Units 01/06/2018    2:59 PM  CBC  WBC 3.4 - 10.8 x10E3/uL  4.9   Hemoglobin 11.1 - 15.9 g/dL 11.7   Hematocrit 34.0 - 46.6 % 35.0   Platelets 150 - 450 x10E3/uL 267     Lipid Panel No results for input(s): "CHOL", "TRIG", "[place]", "VLDL", "HDL",  "CHOLHDL", "LDLDIRECT" in the last 8760 hours.  HEMOGLOBIN A1C No results found for: "HGBA1C", "MPG" TSH No results for input(s): "TSH" in the last 8760 hours.  External labs:  Labs 12/05/2021:  Glucose 79  BUN 12 creatinine 0.94  potassium 3.2  ALT 62  Radiology:    Cardiac Studies:     EKG:   02/27/2022: Sinus Rhythm with left atrial enlargement. Nonspecific T-abnormality.  Assessment     ICD-10-CM   1. Tachycardia  R00.0 EKG 12-Lead    PCV ECHOCARDIOGRAM COMPLETE    2. Essential hypertension  I10 PCV ECHOCARDIOGRAM COMPLETE    3. Nonspecific abnormal electrocardiogram (ECG) (EKG)  R94.31 PCV ECHOCARDIOGRAM COMPLETE    4. Mixed hyperlipidemia  E78.2 PCV ECHOCARDIOGRAM COMPLETE       Orders Placed This Encounter  Procedures   EKG 12-Lead   PCV ECHOCARDIOGRAM COMPLETE    Standing Status:   Future    Number of Occurrences:   1    Standing Expiration Date:   02/28/2023    Meds ordered this encounter  Medications   propranolol (INDERAL) 10 MG tablet    Sig: Take 1 tablet (10 mg total) by mouth 3 (three) times daily as needed.    Dispense:  90 tablet    Refill:  6    Medications Discontinued During This Encounter  Medication Reason   buPROPion ER (WELLBUTRIN SR) 100 MG 12 hr tablet Discontinued by provider   etodolac (LODINE) 400 MG tablet Completed Course   gabapentin (NEURONTIN) 600 MG tablet Completed Course   meloxicam (MOBIC) 15 MG tablet Completed Course   OXcarbazepine (TRILEPTAL) 150 MG tablet    pregabalin (LYRICA) 25 MG capsule Completed Course   pravastatin (PRAVACHOL) 80 MG tablet Change in therapy   zolpidem (AMBIEN CR) 6.25 MG CR tablet Completed Course   tiZANidine (ZANAFLEX) 4 MG tablet Completed Course   tapentadol HCl (NUCYNTA) 75 MG tablet Completed Course   sertraline (ZOLOFT) 50 MG tablet Completed Course     Recommendations:   Linda Gomez is a 57 y.o.  female with tachycardia and palpitations   Essential  hypertension Continue current cardiac medications. Encourage low-sodium diet, less than 2000 mg daily. BP is very well controlled on current regiment Follow-up in 1-2 months or sooner if needed.   Nonspecific abnormal electrocardiogram (ECG) (EKG) Tachycardia Echocardiogram ordered PRN Propranolol sent for palpitations   Mixed hyperlipidemia Continue Crestor 40 mg PCP following lipids     Linda Flock, DO, [facility]  02/28/2022, 1:54 PM Office: [phone] Pager: [phone]

## 2022-02-28 ENCOUNTER — Ambulatory Visit: Payer: Medicare PPO

## 2022-02-28 DIAGNOSIS — I10 Essential (primary) hypertension: Secondary | ICD-10-CM | POA: Diagnosis not present

## 2022-02-28 DIAGNOSIS — R9431 Abnormal electrocardiogram [ECG] [EKG]: Secondary | ICD-10-CM | POA: Diagnosis not present

## 2022-02-28 DIAGNOSIS — R000 Tachycardia, unspecified: Secondary | ICD-10-CM

## 2022-02-28 DIAGNOSIS — E782 Mixed hyperlipidemia: Secondary | ICD-10-CM

## 2022-03-03 NOTE — Progress Notes (Signed)
normal

## 2022-03-04 NOTE — Progress Notes (Signed)
Gave patient results, knowledged understanding and had no further questions.

## 2022-03-11 DIAGNOSIS — Z01419 Encounter for gynecological examination (general) (routine) without abnormal findings: Secondary | ICD-10-CM | POA: Diagnosis not present

## 2022-03-11 DIAGNOSIS — N952 Postmenopausal atrophic vaginitis: Secondary | ICD-10-CM | POA: Diagnosis not present

## 2022-03-11 DIAGNOSIS — Z124 Encounter for screening for malignant neoplasm of cervix: Secondary | ICD-10-CM | POA: Diagnosis not present

## 2022-03-11 DIAGNOSIS — Z1151 Encounter for screening for human papillomavirus (HPV): Secondary | ICD-10-CM | POA: Diagnosis not present

## 2022-03-27 DIAGNOSIS — G500 Trigeminal neuralgia: Secondary | ICD-10-CM | POA: Diagnosis not present

## 2022-03-27 DIAGNOSIS — G43719 Chronic migraine without aura, intractable, without status migrainosus: Secondary | ICD-10-CM | POA: Diagnosis not present

## 2022-03-27 DIAGNOSIS — M542 Cervicalgia: Secondary | ICD-10-CM | POA: Diagnosis not present

## 2022-03-27 DIAGNOSIS — R7401 Elevation of levels of liver transaminase levels: Secondary | ICD-10-CM | POA: Diagnosis not present

## 2022-04-02 DIAGNOSIS — Z0000 Encounter for general adult medical examination without abnormal findings: Secondary | ICD-10-CM | POA: Diagnosis not present

## 2022-04-02 DIAGNOSIS — D649 Anemia, unspecified: Secondary | ICD-10-CM | POA: Diagnosis not present

## 2022-04-02 DIAGNOSIS — Z1159 Encounter for screening for other viral diseases: Secondary | ICD-10-CM | POA: Diagnosis not present

## 2022-04-02 DIAGNOSIS — E782 Mixed hyperlipidemia: Secondary | ICD-10-CM | POA: Diagnosis not present

## 2022-04-02 DIAGNOSIS — G43909 Migraine, unspecified, not intractable, without status migrainosus: Secondary | ICD-10-CM | POA: Diagnosis not present

## 2022-04-02 DIAGNOSIS — R7303 Prediabetes: Secondary | ICD-10-CM | POA: Diagnosis not present

## 2022-04-02 DIAGNOSIS — Z79899 Other long term (current) drug therapy: Secondary | ICD-10-CM | POA: Diagnosis not present

## 2022-04-02 DIAGNOSIS — I10 Essential (primary) hypertension: Secondary | ICD-10-CM | POA: Diagnosis not present

## 2022-04-02 DIAGNOSIS — M797 Fibromyalgia: Secondary | ICD-10-CM | POA: Diagnosis not present

## 2022-04-16 NOTE — Progress Notes (Unsigned)
Primary Physician/Referring:  Alyson Ingles, PA-C  Patient ID: Linda Gomez, female    DOB: 1965-11-01, 57 y.o.   MRN: [id number]  No chief complaint on file.  HPI:    Linda Gomez  is a 57 y.o. female with past medical history significant for hypertension and hyperlipidemia who is here to follow up care with cardiology due to tachycardia and palpitations.  On last visit patient was concerned for high heart rates alarmed by her smartwatch. She denied any chest pain, shortness of breath, diaphoresis, syncope, edema, orthopnea, PND. PRN propanolol was ordered for palpitations, and an echocardiogram was ordered. (Results below)  Today she ***   Past Medical History:  Diagnosis Date   Allergy    Anxiety    Arthritis    Bilateral myofascial pain    Depression    Fibromyalgia    GERD (gastroesophageal reflux disease)    Hyperlipidemia    Hypertension    Neuromuscular disorder (HCC)    Restless leg syndrome    Trigeminal neuralgia    Trigeminal neuralgia    Trigeminal neuralgia    Trigeminal neuropathy    Past Surgical History:  Procedure Laterality Date   BRAIN SURGERY     CESAREAN SECTION     Family History  Problem Relation Age of Onset   Hyperlipidemia Mother    Hypertension Mother    Diabetes Mother    Cancer Mother    Lung cancer Father    Diabetes Father     Social History   Tobacco Use   Smoking status: Never   Smokeless tobacco: Never  Substance Use Topics   Alcohol use: Yes    Alcohol/week: 1.0 standard drink of alcohol    Types: 1 Glasses of wine per week   Marital Status: Married  ROS  *** Objective  There were no vitals taken for this visit. There is no height or weight on file to calculate BMI.     02/27/2022   10:39 AM 08/13/2018   11:48 AM 08/13/2018   11:38 AM  Vitals with BMI  Height 5\' 3"     Weight 148 lbs 6 oz    BMI 26.29    Systolic 109 116 630  Diastolic 75 81 69  Pulse 94       ***  Medications and allergies   No Known Allergies   Medication list after today's encounter   Current Outpatient Medications:    Black Pepper-Turmeric (TURMERIC COMPLEX/BLACK PEPPER PO), Take by mouth 2 (two) times a day., Disp: , Rfl:    Cholecalciferol (D 1000) 25 MCG (1000 UT) CHEW, Chew 1,000 Units by mouth daily., Disp: , Rfl:    DULoxetine (CYMBALTA) 30 MG capsule, Take 30 mg by mouth daily., Disp: , Rfl:    fexofenadine (ALLEGRA) 180 MG tablet, Take 180 mg by mouth daily., Disp: , Rfl:    fluticasone (FLONASE) 50 MCG/ACT nasal spray, Place into the nose., Disp: , Rfl:    Fremanezumab-vfrm 225 MG/1.5ML SOSY, Inject 225 mg into the skin every 28 (twenty-eight) days., Disp: , Rfl:    hydrochlorothiazide (HYDRODIURIL) 12.5 MG tablet, Take 12.5 mg by mouth daily., Disp: , Rfl:    magnesium gluconate (MAGONATE) 500 MG tablet, Take 500 mg by mouth 2 (two) times daily., Disp: , Rfl:    methocarbamol (ROBAXIN) 750 MG tablet, Take 750 mg by mouth in the morning and at bedtime., Disp: , Rfl:    Multiple Vitamin (MULTIVITAMIN) tablet, Take 1 tablet by mouth daily.,  Disp: , Rfl:    omeprazole (PRILOSEC) 20 MG capsule, Take 20 mg by mouth daily as needed. , Disp: , Rfl:    Potassium Chloride ER 20 MEQ TBCR, Take 1 tablet by mouth 2 (two) times daily., Disp: , Rfl:    propranolol (INDERAL) 10 MG tablet, Take 1 tablet (10 mg total) by mouth 3 (three) times daily as needed., Disp: 90 tablet, Rfl: 6   rizatriptan (MAXALT) 10 MG tablet, Take 10 mg by mouth as needed., Disp: , Rfl:    rosuvastatin (CRESTOR) 40 MG tablet, Take 40 mg by mouth daily., Disp: , Rfl:    topiramate (TOPAMAX) 200 MG tablet, Take 200 mg by mouth daily., Disp: , Rfl:    vitamin C (ASCORBIC ACID) 250 MG tablet, Take 250 mg by mouth daily., Disp: , Rfl:    Vitamin E 45 MG (100 UNIT) CAPS, Take by mouth., Disp: , Rfl:   Laboratory examination:   Lab Results  Component Value Date   NA 141 04/15/2018   K 3.6 04/15/2018   CO2 21 01/06/2018   GLUCOSE 90  04/15/2018   BUN 6 04/15/2018   CREATININE 0.91 04/15/2018   CALCIUM 9.4 04/15/2018   GFRNONAA 72 04/15/2018       Latest Ref Rng & Units 04/15/2018   11:15 AM 01/06/2018    2:59 PM  CMP  Glucose 65 - 99 mg/dL 90  88   BUN 6 - 24 mg/dL 6  10   Creatinine 0.98 - 1.00 mg/dL 1.19  1.47   Sodium 829 - 144 mmol/L 141  139   Potassium 3.5 - 5.2 mmol/L 3.6  4.1   Chloride 96 - 106 mmol/L 102  105   CO2 20 - 29 mmol/L  21   Calcium 8.7 - 10.2 mg/dL 9.4  9.2   Total Protein 6.0 - 8.5 g/dL 7.3  6.7   Total Bilirubin 0.0 - 1.2 mg/dL 0.2  <5.6   Alkaline Phos 39 - 117 IU/L 125  133   AST 0 - 40 IU/L 35  16   ALT 0 - 32 IU/L  25       Latest Ref Rng & Units 01/06/2018    2:59 PM  CBC  WBC 3.4 - 10.8 x10E3/uL 4.9   Hemoglobin 11.1 - 15.9 g/dL 21.3   Hematocrit 08.6 - 46.6 % 35.0   Platelets 150 - 450 x10E3/uL 267     Lipid Panel 04/02/2022 Chol 183, TG 86, HDL 82, LDL 85  11/01/2021 Chol 211, TG 89, HDL 96.9, LDL 96  HEMOGLOBIN A1C 04/02/2022: HbA1C 6.2%  TSH No results for input(s): "TSH" in the last 8760 hours.  External labs:   04/05/2022 Glucose 77, BUN/Cr 10/0.891. EGFR 73. Na/K 139/3.4. Rest of the CMP normal  04/02/2022:  H/H 11.6/35.4. MCV 86.0. Platelets 236  12/13/2021 Glucose 86, BUN/Cr 9/0.88. EGFR 76. Na/K 140/4.2. Rest of the CMP normal  Labs 12/05/2021: Glucose 79  BUN 12 creatinine 0.94  potassium 3.2  ALT 62  Radiology:    Cardiac Studies:   Echocardiogram 02/28/2022: Normal LV systolic function with visual EF 60-65%. Left ventricle cavity is normal in size. Normal left ventricular wall thickness. Normal global wall motion. Normal diastolic filling pattern, normal LAP. No significant valvular heart disease. No prior study for comparison.    EKG:   02/27/2022: Sinus Rhythm with left atrial enlargement. Nonspecific T-abnormality.  Assessment   No diagnosis found.    No orders of the defined types were placed  in this  encounter.   No orders of the defined types were placed in this encounter.   There are no discontinued medications.    Recommendations:   Linda Gomez is a 57 y.o.  female with tachycardia and palpitations    Essential hypertension BP today in office is ** Plan - Continue current cardiac medications - Continue to follow low-sodium diet, less than 2000 mg a day    Nonspecific abnormal electrocardiogram (ECG) (EKG) Tachycardia Echocardiogram is normal,  LV systolic function with visual EF 60-65%. Patient has/has not had any **** Plan  - Continue PRN propanolol for palpitations  Mixed hyperlipidemia Patient tolerating statin therapy, no reports of myalgia *** Plan Continue Crestor 40 mg PCP following lipids     Clotilde Dieter, DO, [facility]  04/16/2022, 7:21 PM Office: [phone] Pager: [phone]

## 2022-04-17 ENCOUNTER — Ambulatory Visit: Payer: Self-pay | Admitting: Internal Medicine

## 2022-04-21 DIAGNOSIS — R059 Cough, unspecified: Secondary | ICD-10-CM | POA: Diagnosis not present

## 2022-04-21 DIAGNOSIS — J029 Acute pharyngitis, unspecified: Secondary | ICD-10-CM | POA: Diagnosis not present

## 2022-04-21 DIAGNOSIS — J069 Acute upper respiratory infection, unspecified: Secondary | ICD-10-CM | POA: Diagnosis not present

## 2022-05-03 DIAGNOSIS — Z79899 Other long term (current) drug therapy: Secondary | ICD-10-CM | POA: Diagnosis not present

## 2022-05-31 DIAGNOSIS — M797 Fibromyalgia: Secondary | ICD-10-CM | POA: Diagnosis not present

## 2022-05-31 DIAGNOSIS — G43719 Chronic migraine without aura, intractable, without status migrainosus: Secondary | ICD-10-CM | POA: Diagnosis not present

## 2022-05-31 DIAGNOSIS — F32A Depression, unspecified: Secondary | ICD-10-CM | POA: Diagnosis not present

## 2022-05-31 DIAGNOSIS — F419 Anxiety disorder, unspecified: Secondary | ICD-10-CM | POA: Diagnosis not present

## 2022-05-31 DIAGNOSIS — I10 Essential (primary) hypertension: Secondary | ICD-10-CM | POA: Diagnosis not present

## 2022-06-25 DIAGNOSIS — R000 Tachycardia, unspecified: Secondary | ICD-10-CM | POA: Diagnosis not present

## 2022-07-17 ENCOUNTER — Ambulatory Visit: Payer: Self-pay | Admitting: Cardiology

## 2022-07-30 ENCOUNTER — Encounter: Payer: Self-pay | Admitting: Cardiology

## 2022-07-30 ENCOUNTER — Ambulatory Visit: Payer: Medicare PPO | Admitting: Cardiology

## 2022-07-30 VITALS — BP 110/71 | HR 101 | Ht 63.0 in | Wt 150.0 lb

## 2022-07-30 DIAGNOSIS — I10 Essential (primary) hypertension: Secondary | ICD-10-CM

## 2022-07-30 DIAGNOSIS — G43019 Migraine without aura, intractable, without status migrainosus: Secondary | ICD-10-CM

## 2022-07-30 DIAGNOSIS — E7800 Pure hypercholesterolemia, unspecified: Secondary | ICD-10-CM | POA: Diagnosis not present

## 2022-07-30 DIAGNOSIS — R002 Palpitations: Secondary | ICD-10-CM | POA: Diagnosis not present

## 2022-07-30 DIAGNOSIS — R739 Hyperglycemia, unspecified: Secondary | ICD-10-CM

## 2022-07-30 MED ORDER — PROPRANOLOL HCL ER 80 MG PO CP24: 80.0000 mg | ORAL_CAPSULE | Freq: Every day | ORAL | 2 refills | Status: DC

## 2022-07-30 NOTE — Progress Notes (Signed)
Primary Physician/Referring:  Aliene Beams, MD  Patient ID: Linda Gomez, female    DOB: 03-12-1965, 57 y.o.   MRN: [id number]  No chief complaint on file.  HPI:    Linda Gomez  is a 57 y.o. AAF patient  with migraine without aura, primary hypertension, hyperglycemia, hypercholesterolemia, initially referred to [place] and seen by [place] in January 2024 for palpitations.  Since addition of propranolol, states that her symptoms of palpitation are improved.  Otherwise remains asymptomatic.  Past Medical History:  Diagnosis Date   Allergy    Anxiety    Arthritis    Bilateral myofascial pain    Depression    Fibromyalgia    GERD (gastroesophageal reflux disease)    Hyperlipidemia    Hypertension    Neuromuscular disorder (HCC)    Restless leg syndrome    Trigeminal neuralgia    Trigeminal neuralgia    Trigeminal neuralgia    Trigeminal neuropathy    Past Surgical History:  Procedure Laterality Date   BRAIN SURGERY     CESAREAN SECTION     Family History  Problem Relation Age of Onset   Hyperlipidemia Mother    Hypertension Mother    Diabetes Mother    Cancer Mother    Lung cancer Father    Diabetes Father     Social History   Tobacco Use   Smoking status: Never   Smokeless tobacco: Never  Substance Use Topics   Alcohol use: Yes    Alcohol/week: 1.0 standard drink of alcohol    Types: 1 Glasses of wine per week   Marital Status: Married  ROS  Review of Systems  Cardiovascular:  Positive for palpitations. Negative for chest pain, dyspnea on exertion and leg swelling.   Objective      07/30/2022    2:59 PM 02/27/2022   10:39 AM 08/13/2018   11:48 AM  Vitals with BMI  Height 5\' 3"  5\' 3"    Weight 150 lbs 148 lbs 6 oz   BMI 26.58 26.29   Systolic 110 109 956  Diastolic 71 75 81  Pulse 101 94    Blood pressure 110/71, pulse (!) 101, height 5\' 3"  (1.6 m), weight 150 lb (68 kg), SpO2 98 %.  Physical Exam Neck:     Vascular: No carotid bruit or JVD.   Cardiovascular:     Rate and Rhythm: Normal rate and regular rhythm.     Pulses: Intact distal pulses.     Heart sounds: Normal heart sounds. No murmur heard.    No gallop.  Pulmonary:     Effort: Pulmonary effort is normal.     Breath sounds: Normal breath sounds.  Abdominal:     General: Bowel sounds are normal.     Palpations: Abdomen is soft.  Musculoskeletal:     Right lower leg: No edema.     Left lower leg: No edema.     Laboratory examination:   External labs:   Labs done 06/16/2021:  Hb 12.7/HCT 38.6, platelets 264, normal indicis.  Serum glucose 87 mg, BUN 8, creatinine 1.0, EGFR 66,, potassium 3.4.  A1c 6.1%.  TSH normal at 1.053.  Total cholesterol: 111, triglycerides 89, HDL 96, LDL 96.  Radiology:    Cardiac Studies:   PCV ECHOCARDIOGRAM COMPLETE 02/28/2022  Narrative Echocardiogram 02/28/2022: Normal LV systolic function with visual EF 60-65%. Left ventricle cavity is normal in size. Normal left ventricular wall thickness. Normal global wall motion. Normal diastolic filling pattern, normal LAP.  No significant valvular heart disease. No prior study for comparison.    EKG:   EKG 02/27/2022: Normal sinus rhythm at the rate of 91 bpm, normal axis.  Poor R progression, probably normal variant.  No evidence of ischemia otherwise normal EKG.    Medications and allergies  No Known Allergies   Medication list   Current Outpatient Medications:    Biotin 16109 MCG TBDP, Take 1 tablet by mouth daily., Disp: , Rfl:    Black Pepper-Turmeric (TURMERIC COMPLEX/BLACK PEPPER PO), Take by mouth 2 (two) times a day., Disp: , Rfl:    calcium carbonate (OSCAL) 1500 (600 Ca) MG TABS tablet, Take 600 mg of elemental calcium by mouth 2 (two) times daily with a meal., Disp: , Rfl:    Cholecalciferol (D 1000) 25 MCG (1000 UT) CHEW, Chew 1,000 Units by mouth daily., Disp: , Rfl:    cyclobenzaprine (FLEXERIL) 10 MG tablet, Take 10 mg by mouth 2 (two) times daily as  needed., Disp: , Rfl:    DULoxetine (CYMBALTA) 30 MG capsule, Take 30 mg by mouth daily., Disp: , Rfl:    fexofenadine (ALLEGRA) 180 MG tablet, Take 180 mg by mouth daily., Disp: , Rfl:    fluticasone (FLONASE) 50 MCG/ACT nasal spray, Place into the nose., Disp: , Rfl:    Fremanezumab-vfrm 225 MG/1.5ML SOSY, Inject 225 mg into the skin every 28 (twenty-eight) days., Disp: , Rfl:    magnesium gluconate (MAGONATE) 500 MG tablet, Take 500 mg by mouth 2 (two) times daily., Disp: , Rfl:    Multiple Vitamin (MULTIVITAMIN) tablet, Take 1 tablet by mouth daily., Disp: , Rfl:    omeprazole (PRILOSEC) 20 MG capsule, Take 20 mg by mouth daily as needed. , Disp: , Rfl:    propranolol ER (INDERAL LA) 80 MG 24 hr capsule, Take 1 capsule (80 mg total) by mouth daily., Disp: 30 capsule, Rfl: 2   rizatriptan (MAXALT) 10 MG tablet, Take 10 mg by mouth as needed., Disp: , Rfl:    rosuvastatin (CRESTOR) 40 MG tablet, Take 40 mg by mouth daily., Disp: , Rfl:    Sodium Caprylate POWD, Take 1 tablet by mouth daily., Disp: , Rfl:    topiramate (TOPAMAX) 200 MG tablet, Take 200 mg by mouth daily., Disp: , Rfl:    vitamin C (ASCORBIC ACID) 250 MG tablet, Take 250 mg by mouth daily., Disp: , Rfl:    Vitamin E 45 MG (100 UNIT) CAPS, Take by mouth., Disp: , Rfl:   Assessment     ICD-10-CM   1. Palpitations  R00.2 propranolol ER (INDERAL LA) 80 MG 24 hr capsule    2. Primary hypertension  I10 propranolol ER (INDERAL LA) 80 MG 24 hr capsule    3. Pure hypercholesterolemia  E78.00     4. Hyperglycemia  R73.9     5. Intractable migraine without aura and without status migrainosus  G43.019 propranolol ER (INDERAL LA) 80 MG 24 hr capsule       No orders of the defined types were placed in this encounter.   Meds ordered this encounter  Medications   propranolol ER (INDERAL LA) 80 MG 24 hr capsule    Sig: Take 1 capsule (80 mg total) by mouth daily.    Dispense:  30 capsule    Refill:  2    Medications  Discontinued During This Encounter  Medication Reason   methocarbamol (ROBAXIN) 750 MG tablet    propranolol (INDERAL) 10 MG tablet Change in therapy  Potassium Chloride ER 20 MEQ TBCR Discontinued by provider   hydrochlorothiazide (HYDRODIURIL) 12.5 MG tablet Change in therapy     Recommendations:   Crystallee Mancillas is a 57 y.o. AAF patient  with migraine without aura, primary hypertension, hyperglycemia, hypercholesterolemia, initially referred to [place] and seen by [place] in January 2024 for palpitations.  1. Palpitations Patient symptoms of palpitation mostly occurring during routine activities are mostly indicated of PACs and PVCs.  She has had improvement with the symptoms with the use of beta-blocker.  I will discontinue low-dose beta-blocker therapy, switch her to propranolol LA 80 mg daily.  I proposed to use this medication both from migraine headache standpoint, palpitations and also primary hypertension.  - propranolol ER (INDERAL LA) 80 MG 24 hr capsule; Take 1 capsule (80 mg total) by mouth daily.  Dispense: 30 capsule; Refill: 2  2. Primary hypertension I will discontinue hydrochlorothiazide and potassium supplements, she has had low potassium as well.  If I have to use another agent for blood pressure control, ACE inhibitor or an ARB would be my next choice.  This will also help with migraine headaches.  Combination of beta-blocker and ARB are found to benefit patients with migraine headaches and also all preventive. - propranolol ER (INDERAL LA) 80 MG 24 hr capsule; Take 1 capsule (80 mg total) by mouth daily.  Dispense: 30 capsule; Refill: 2  3. Pure hypercholesterolemia Patient is presently on a statin, she does have hyperglycemia as a risk factor, hypertension is a risk factor, she is African-American 57 years of age, appropriate dose presently, no changes were done today.  4. Hyperglycemia As dictated above, if satisfactory, we have discussed regarding diet reducing  carbohydrate intake.  5. Intractable migraine without aura and without status migrainosus Hopefully addition of propranolol will help, an ACE inhibitor or ARB mostly ARB are also found to be helpful in migraine headaches.  She will follow-up with her PCP for further management.  With regard to cardiac risk stratification, she is completely asymptomatic, she just finished 6 weeks of intense what [facility] with very intense exercise without any chest pain or dyspnea.  - propranolol ER (INDERAL LA) 80 MG 24 hr capsule; Take 1 capsule (80 mg total) by mouth daily.  Dispense: 30 capsule; Refill: 2  Other orders - cyclobenzaprine (FLEXERIL) 10 MG tablet; Take 10 mg by mouth 2 (two) times daily as needed. - Biotin 16109 MCG TBDP; Take 1 tablet by mouth daily. - Sodium Caprylate POWD; Take 1 tablet by mouth daily. - calcium carbonate (OSCAL) 1500 (600 Ca) MG TABS tablet; Take 600 mg of elemental calcium by mouth 2 (two) times daily with a meal.    Yates Decamp, MD, [facility] 07/30/2022, 3:24 PM Office: [phone]

## 2022-08-27 ENCOUNTER — Other Ambulatory Visit: Payer: Self-pay | Admitting: Family Medicine

## 2022-08-27 DIAGNOSIS — Z1231 Encounter for screening mammogram for malignant neoplasm of breast: Secondary | ICD-10-CM

## 2022-09-11 IMAGING — US US ABDOMEN LIMITED
1 series · 14 of 23 positions shown · non-contrast
Comparison: None.

CLINICAL DATA: Elevated liver enzymes

EXAM:
ULTRASOUND ABDOMEN LIMITED RIGHT UPPER QUADRANT

[Series 1: us abdomen limited · 0.17mm/px · 14 of 23 slices shown]
[im 1/23]
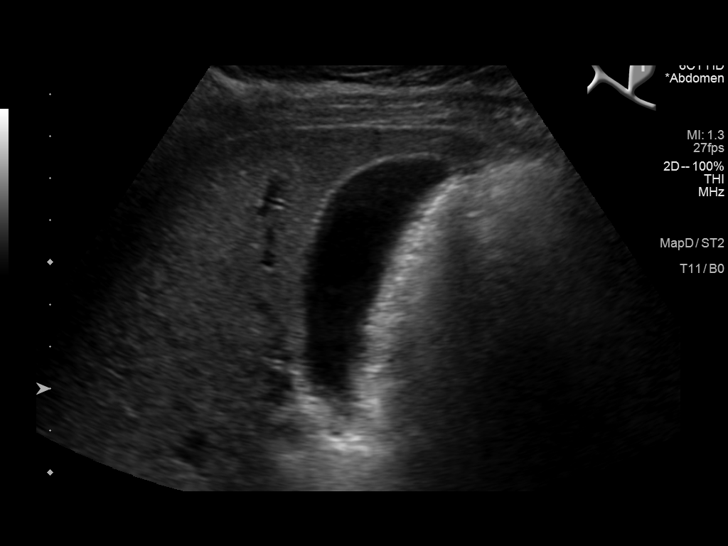
[im 3/23]
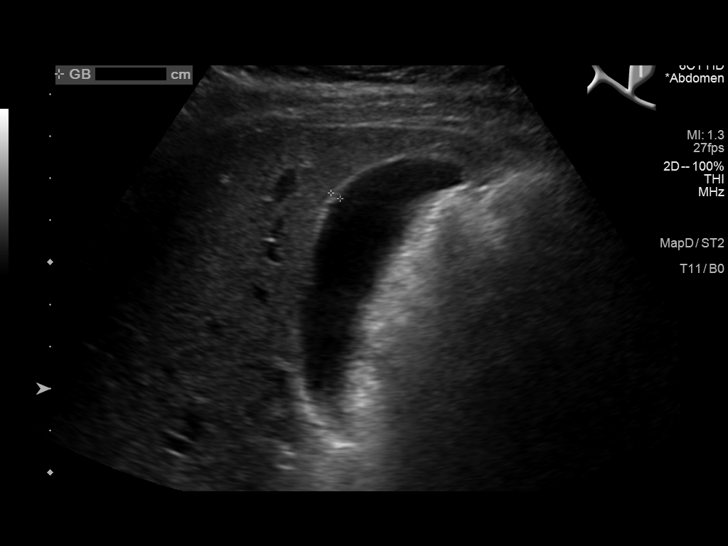
[im 5/23]
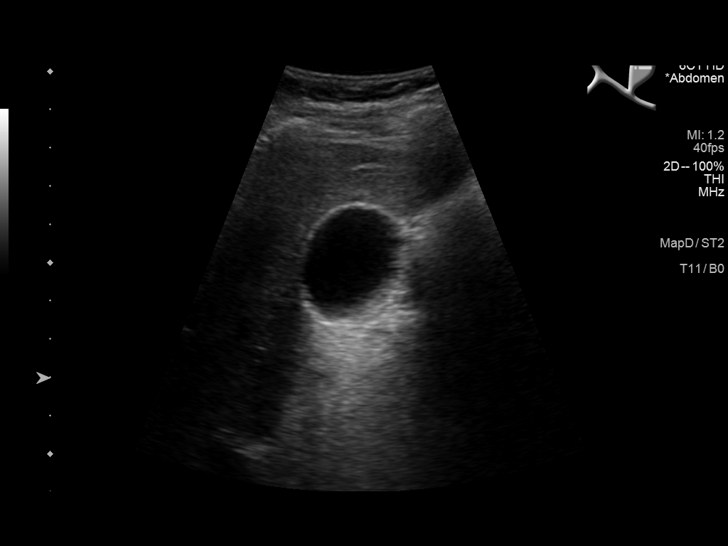
[im 6/23]
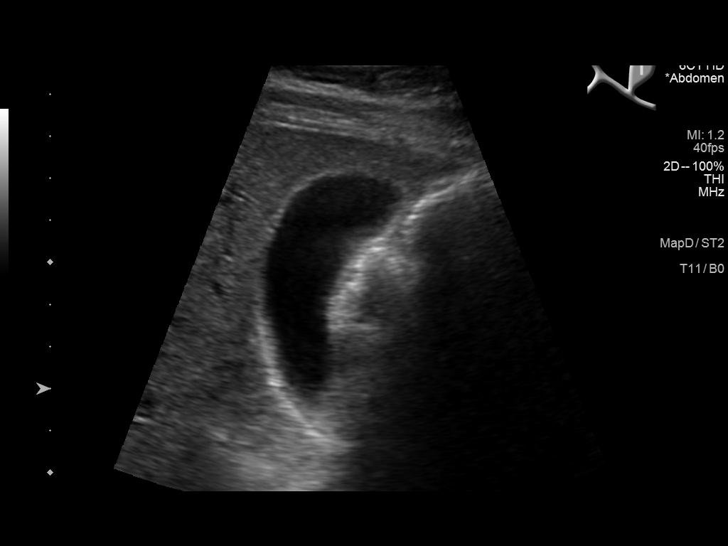
[im 8/23]
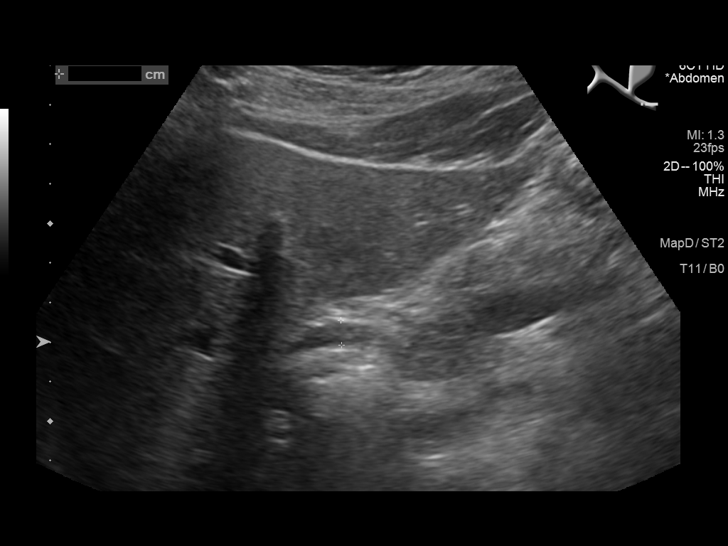
[im 10/23]
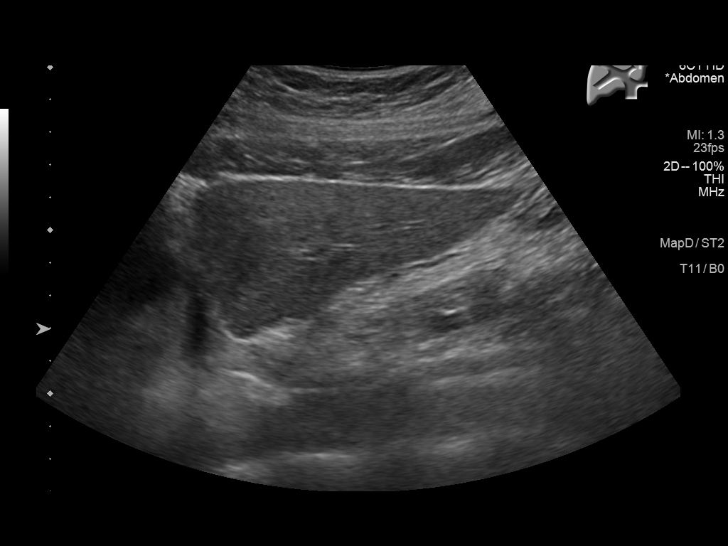
[im 11/23]
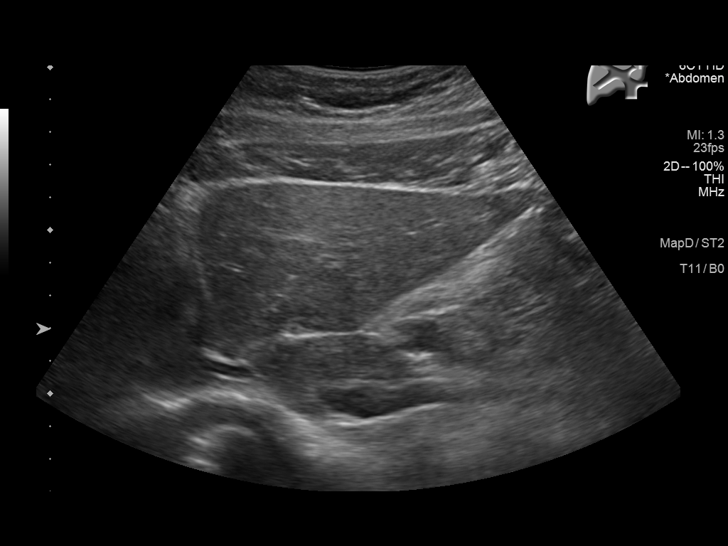
[im 13/23]
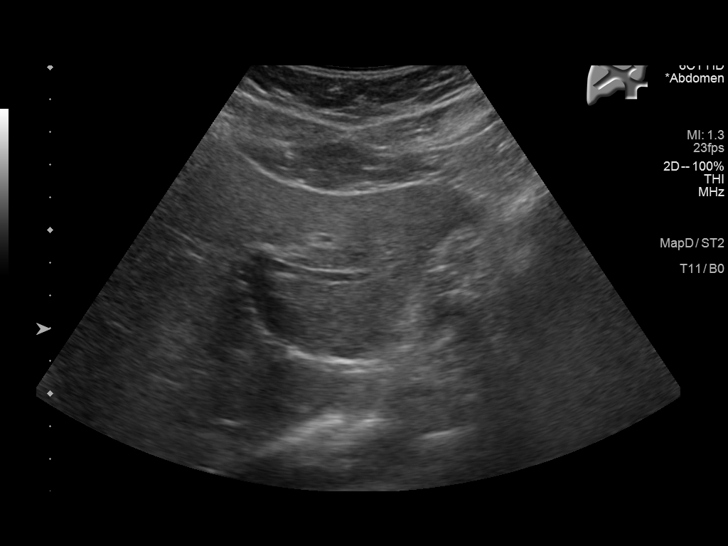
[im 14/23]
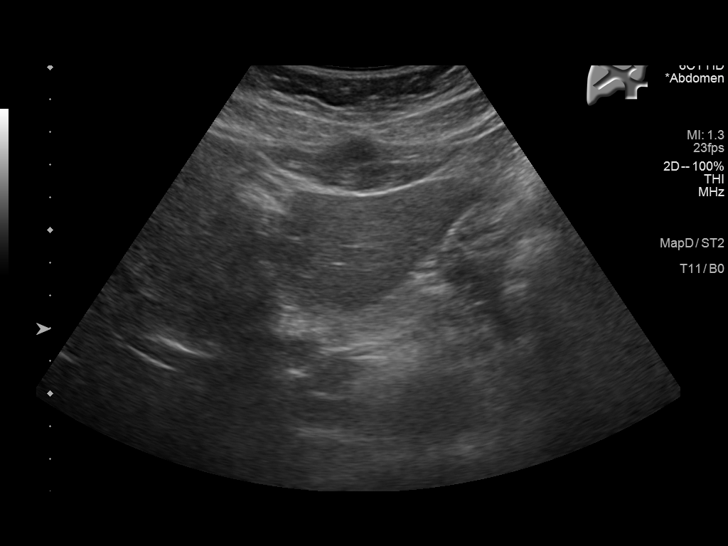
[im 16/23]
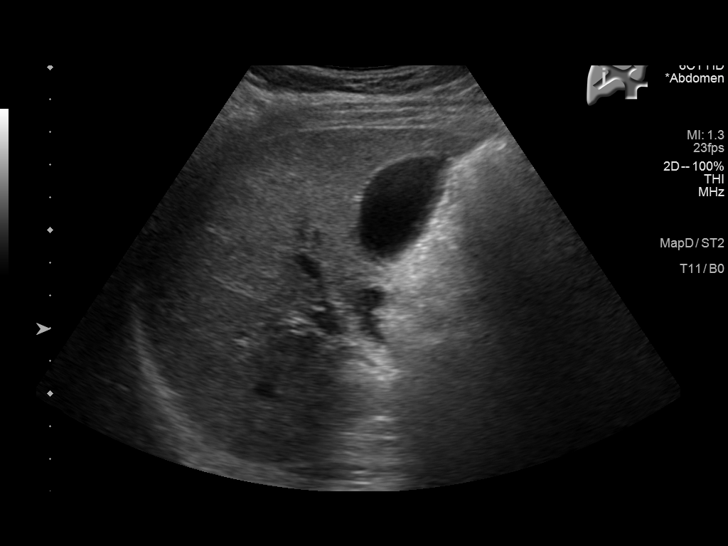
[im 18/23]
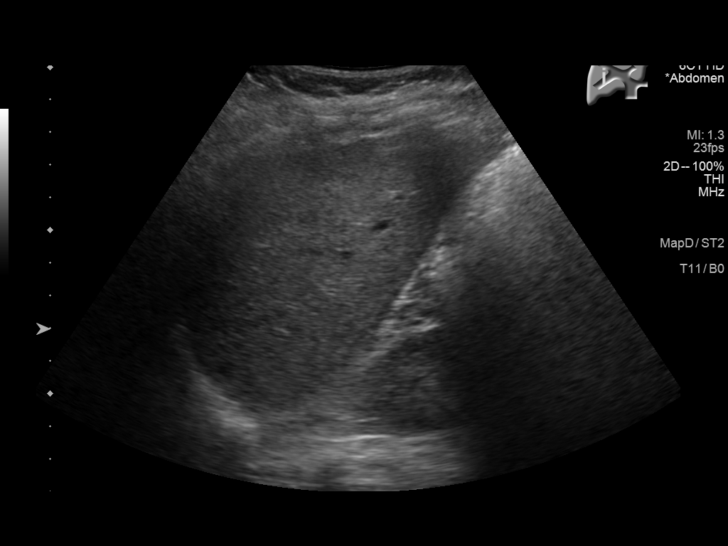
[im 19/23]
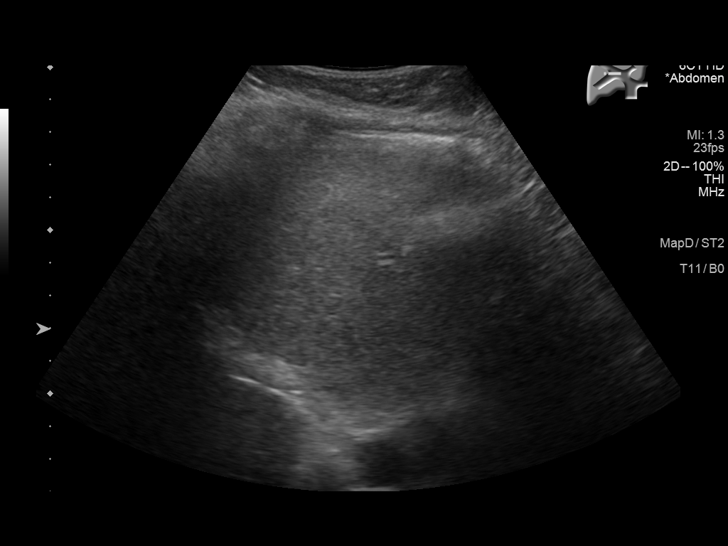
[im 21/23]
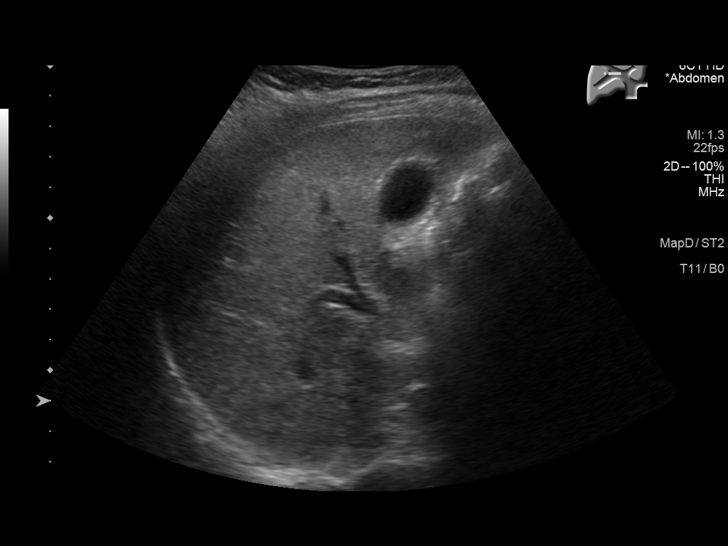
[im 23/23]
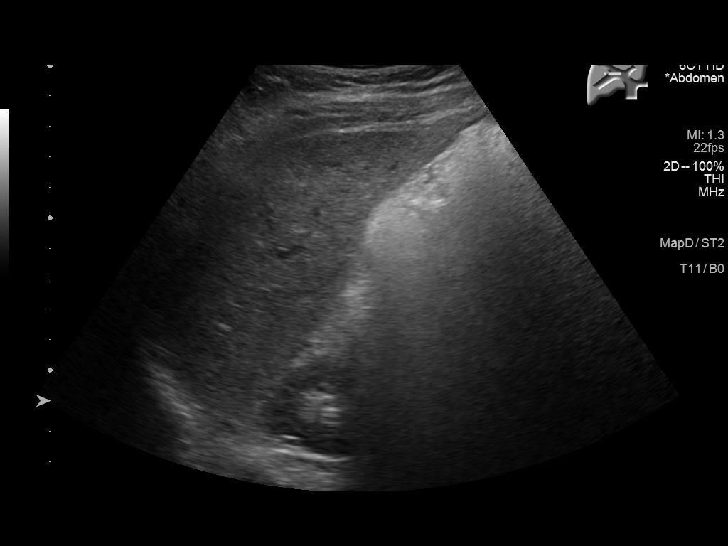

[14 of 23 positions shown; findings below may reference images not displayed]

FINDINGS: Gallbladder:

No gallstones or wall thickening visualized. No sonographic Murphy
sign noted by sonographer.

Common bile duct:

Diameter: 6 mm in proximal diameter

Liver:

Hepatic parenchymal echogenicity is diffusely increased. No focal
intrahepatic masses are seen and there is no intrahepatic biliary
ductal dilation. Portal vein is patent on color Doppler imaging with
normal direction of blood flow towards the liver.

Other: None.
IMPRESSION: Mild hepatic steatosis

## 2022-09-24 ENCOUNTER — Ambulatory Visit
Admission: RE | Admit: 2022-09-24 | Discharge: 2022-09-24 | Disposition: A | Discharge status: Home / Self Care | Source: Ambulatory Visit | Attending: Family Medicine | Admitting: Family Medicine

## 2022-09-24 DIAGNOSIS — Z1231 Encounter for screening mammogram for malignant neoplasm of breast: Secondary | ICD-10-CM | POA: Diagnosis not present

## 2022-10-01 DIAGNOSIS — G43719 Chronic migraine without aura, intractable, without status migrainosus: Secondary | ICD-10-CM | POA: Diagnosis not present

## 2022-10-01 DIAGNOSIS — G500 Trigeminal neuralgia: Secondary | ICD-10-CM | POA: Diagnosis not present

## 2022-10-01 DIAGNOSIS — F32A Depression, unspecified: Secondary | ICD-10-CM | POA: Diagnosis not present

## 2022-10-01 DIAGNOSIS — M542 Cervicalgia: Secondary | ICD-10-CM | POA: Diagnosis not present

## 2022-10-01 DIAGNOSIS — I10 Essential (primary) hypertension: Secondary | ICD-10-CM | POA: Diagnosis not present

## 2022-10-01 DIAGNOSIS — F419 Anxiety disorder, unspecified: Secondary | ICD-10-CM | POA: Diagnosis not present

## 2022-10-03 DIAGNOSIS — Z23 Encounter for immunization: Secondary | ICD-10-CM | POA: Diagnosis not present

## 2022-10-03 DIAGNOSIS — E876 Hypokalemia: Secondary | ICD-10-CM | POA: Diagnosis not present

## 2022-10-03 DIAGNOSIS — D649 Anemia, unspecified: Secondary | ICD-10-CM | POA: Diagnosis not present

## 2022-10-03 DIAGNOSIS — H00011 Hordeolum externum right upper eyelid: Secondary | ICD-10-CM | POA: Diagnosis not present

## 2022-10-03 DIAGNOSIS — Z79899 Other long term (current) drug therapy: Secondary | ICD-10-CM | POA: Diagnosis not present

## 2022-10-03 DIAGNOSIS — R7303 Prediabetes: Secondary | ICD-10-CM | POA: Diagnosis not present

## 2022-10-11 DIAGNOSIS — H5203 Hypermetropia, bilateral: Secondary | ICD-10-CM | POA: Diagnosis not present

## 2022-10-11 DIAGNOSIS — H52223 Regular astigmatism, bilateral: Secondary | ICD-10-CM | POA: Diagnosis not present

## 2022-10-11 DIAGNOSIS — H0011 Chalazion right upper eyelid: Secondary | ICD-10-CM | POA: Diagnosis not present

## 2022-10-11 DIAGNOSIS — H04123 Dry eye syndrome of bilateral lacrimal glands: Secondary | ICD-10-CM | POA: Diagnosis not present

## 2022-10-28 ENCOUNTER — Other Ambulatory Visit: Payer: Self-pay | Admitting: Cardiology

## 2022-10-28 DIAGNOSIS — G43019 Migraine without aura, intractable, without status migrainosus: Secondary | ICD-10-CM

## 2022-10-28 DIAGNOSIS — R002 Palpitations: Secondary | ICD-10-CM

## 2022-10-28 DIAGNOSIS — I10 Essential (primary) hypertension: Secondary | ICD-10-CM

## 2022-11-06 ENCOUNTER — Other Ambulatory Visit: Payer: Self-pay | Admitting: Internal Medicine

## 2022-11-06 DIAGNOSIS — R7401 Elevation of levels of liver transaminase levels: Secondary | ICD-10-CM | POA: Diagnosis not present

## 2022-11-13 ENCOUNTER — Ambulatory Visit
Admission: RE | Admit: 2022-11-13 | Discharge: 2022-11-13 | Disposition: A | Discharge status: Home / Self Care | Payer: Medicare PPO | Source: Ambulatory Visit | Attending: Internal Medicine | Admitting: Internal Medicine

## 2022-11-13 DIAGNOSIS — R7401 Elevation of levels of liver transaminase levels: Secondary | ICD-10-CM

## 2022-11-13 DIAGNOSIS — R945 Abnormal results of liver function studies: Secondary | ICD-10-CM | POA: Diagnosis not present

## 2022-12-24 DIAGNOSIS — G43711 Chronic migraine without aura, intractable, with status migrainosus: Secondary | ICD-10-CM | POA: Diagnosis not present

## 2022-12-24 DIAGNOSIS — R7303 Prediabetes: Secondary | ICD-10-CM | POA: Diagnosis not present

## 2022-12-24 DIAGNOSIS — F419 Anxiety disorder, unspecified: Secondary | ICD-10-CM | POA: Diagnosis not present

## 2022-12-24 DIAGNOSIS — M542 Cervicalgia: Secondary | ICD-10-CM | POA: Diagnosis not present

## 2022-12-24 DIAGNOSIS — I10 Essential (primary) hypertension: Secondary | ICD-10-CM | POA: Diagnosis not present

## 2022-12-24 DIAGNOSIS — M797 Fibromyalgia: Secondary | ICD-10-CM | POA: Diagnosis not present

## 2022-12-24 DIAGNOSIS — G43719 Chronic migraine without aura, intractable, without status migrainosus: Secondary | ICD-10-CM | POA: Diagnosis not present

## 2022-12-24 DIAGNOSIS — F32A Depression, unspecified: Secondary | ICD-10-CM | POA: Diagnosis not present

## 2023-01-09 DIAGNOSIS — R1013 Epigastric pain: Secondary | ICD-10-CM | POA: Diagnosis not present

## 2023-01-09 DIAGNOSIS — R7401 Elevation of levels of liver transaminase levels: Secondary | ICD-10-CM | POA: Diagnosis not present

## 2023-01-10 ENCOUNTER — Other Ambulatory Visit (HOSPITAL_COMMUNITY): Payer: Self-pay | Admitting: Internal Medicine

## 2023-01-10 DIAGNOSIS — R7401 Elevation of levels of liver transaminase levels: Secondary | ICD-10-CM

## 2023-01-10 DIAGNOSIS — R748 Abnormal levels of other serum enzymes: Secondary | ICD-10-CM

## 2023-01-16 ENCOUNTER — Emergency Department: Payer: Medicare PPO

## 2023-01-16 ENCOUNTER — Emergency Department
Admission: EM | Admit: 2023-01-16 | Discharge: 2023-01-16 | Disposition: A | Discharge status: Home / Self Care | Payer: Medicare PPO | Attending: Emergency Medicine | Admitting: Emergency Medicine

## 2023-01-16 ENCOUNTER — Other Ambulatory Visit: Payer: Self-pay

## 2023-01-16 DIAGNOSIS — R519 Headache, unspecified: Secondary | ICD-10-CM | POA: Insufficient documentation

## 2023-01-16 MED ORDER — KETOROLAC TROMETHAMINE 15 MG/ML IJ SOLN
15.0000 mg | Freq: Once | INTRAMUSCULAR | Status: AC
  Administered 2023-01-16: 15 mg via INTRAVENOUS
  Filled 2023-01-16: qty 1

## 2023-01-16 MED ORDER — DIPHENHYDRAMINE HCL 50 MG/ML IJ SOLN
25.0000 mg | Freq: Once | INTRAMUSCULAR | Status: AC
  Administered 2023-01-16: 25 mg via INTRAVENOUS
  Filled 2023-01-16: qty 1

## 2023-01-16 MED ORDER — ACETAMINOPHEN 500 MG PO TABS
1000.0000 mg | ORAL_TABLET | Freq: Once | ORAL | Status: AC
  Administered 2023-01-16: 1000 mg via ORAL
  Filled 2023-01-16: qty 2

## 2023-01-16 MED ORDER — SODIUM CHLORIDE 0.9 % IV BOLUS
1000.0000 mL | Freq: Once | INTRAVENOUS | Status: AC
  Administered 2023-01-16: 1000 mL via INTRAVENOUS

## 2023-01-16 MED ORDER — PROCHLORPERAZINE EDISYLATE 10 MG/2ML IJ SOLN
10.0000 mg | Freq: Once | INTRAMUSCULAR | Status: AC
  Administered 2023-01-16: 10 mg via INTRAVENOUS
  Filled 2023-01-16: qty 2

## 2023-01-16 MED ORDER — DEXAMETHASONE SODIUM PHOSPHATE 10 MG/ML IJ SOLN
10.0000 mg | Freq: Once | INTRAMUSCULAR | Status: AC
  Administered 2023-01-16: 10 mg via INTRAVENOUS
  Filled 2023-01-16: qty 1

## 2023-01-16 NOTE — ED Triage Notes (Signed)
Pt here with a headache. Pt states she has a hx of migraines but this feels different. Pt states she is having sharp pains 7 mins apart on the back right side of her head. Pt denies falls or injury. Pt denies NVD and cp or SOB. Pt is on medication for her migraines. Pt is having some mild photosensitivity.

## 2023-01-16 NOTE — Discharge Instructions (Signed)
You were seen in the ER today for evaluation of your headache.  Your CT was reassuring.  I am glad you are feeling better after receiving medicines here.  Continue to take your medicines as directed.  Follow-up with your primary care doctor and neurologist for further evaluation.  Return to the ER for new or worsening symptoms.

## 2023-01-16 NOTE — ED Provider Notes (Signed)
[facility] Provider Note    Event Date/Time   First MD Initiated Contact with Patient 01/16/23 1419     (approximate)   History   Headache   HPI  Linda Gomez is a 57 year old female with history of chronic migraines, trigeminal neuralgia presenting to the emergency department for evaluation of headache.  This morning, patient had onset of a headache described as a intermittent sharp stinging sensation over the posterior aspect of the right side of her head.  Reports she has occasionally had a similar feeling with her migraines in the past, but is never been so consistent.  She does report that she has had similar symptoms sometimes on her left side that was associated with trigeminal neuralgia, but this is new for her right side.  She did report that she had been receiving Botox treatments for her chronic migraines, but did recently discontinue this.  Tried taking her prescribed rizatriptan about an hour prior to presentation without significant improvement.  No fevers or chills.  No vision changes.  No numbness, tingling, focal weakness.       Physical Exam   Triage Vital Signs: ED Triage Vitals  Encounter Vitals Group     BP 01/16/23 1252 126/80     Systolic BP Percentile --      Diastolic BP Percentile --      Pulse Rate 01/16/23 1252 65     Resp 01/16/23 1252 17     Temp 01/16/23 1252 97.6 F (36.4 C)     Temp Source 01/16/23 1252 Oral     SpO2 01/16/23 1252 100 %     Weight 01/16/23 1250 149 lb 14.6 oz (68 kg)     Height 01/16/23 1250 5\' 3"  (1.6 m)     Head Circumference --      Peak Flow --      Pain Score 01/16/23 1250 8     Pain Loc --      Pain Education --      Exclude from Growth Chart --     Most recent vital signs: Vitals:   01/16/23 1606 01/16/23 1645  BP:  (!) 142/82  Pulse:  60  Resp:  15  Temp:  97.7 F (36.5 C)  SpO2: 100% 100%     General: Awake, interactive  CV:  Regular rate, good peripheral perfusion.   Resp:  Unlabored respirations.  Abd:  Nondistended.  Neuro:  Alert and oriented, normal extraocular movements, symmetric facial movement, sensation intact over bilateral upper and lower extremities with 5 out of 5 strength.  Normal finger-to-nose testing.   ED Results / Procedures / Treatments   Labs (all labs ordered are listed, but only abnormal results are displayed) Labs Reviewed - No data to display   EKG EKG independently reviewed interpreted by myself (ER attending) demonstrates:    RADIOLOGY Imaging independently reviewed and interpreted by myself demonstrates:  CT head without acute bleed  PROCEDURES:  Critical Care performed: No  Procedures   MEDICATIONS ORDERED IN ED: Medications  sodium chloride 0.9 % bolus 1,000 mL (1,000 mLs Intravenous New Bag/Given 01/16/23 1504)  acetaminophen (TYLENOL) tablet 1,000 mg (1,000 mg Oral Given 01/16/23 1509)  diphenhydrAMINE (BENADRYL) injection 25 mg (25 mg Intravenous Given 01/16/23 1506)  prochlorperazine (COMPAZINE) injection 10 mg (10 mg Intravenous Given 01/16/23 1506)  ketorolac (TORADOL) 15 MG/ML injection 15 mg (15 mg Intravenous Given 01/16/23 1642)  dexamethasone (DECADRON) injection 10 mg (10 mg Intravenous Given 01/16/23 1642)  IMPRESSION / MDM / ASSESSMENT AND PLAN / ED COURSE  I reviewed the triage vital signs and the nursing notes.  Differential diagnosis includes, but is not limited to, exacerbation of chronic migraine, trigeminal neuralgia, consideration but lower suspicion for acute intracranial process such as intracranial bleed, no focal deficits and clinical history not suggestive of CVA  Patient's presentation is most consistent with acute complicated illness / injury requiring diagnostic workup.  57 year old female presenting with headache, known history of chronic migraines but reports this is different in Editor, commissioning.  Discussed trial of symptomatic treatment, but patient is concerned that this is  significantly different than prior headaches, so will obtain CT to further evaluate.  Ordered for symptomatic treatment with IV fluids, Tylenol, Benadryl, Compazine.  CT reassuring.  Patient reassessed and reported improvement, but some ongoing headache.  Ordered for Decadron and Toradol.  On reevaluation following this, patient reported significant improvement in her headache.  She is comfortable with discharge home and outpatient follow-up.  Strict return precautions provided.  Patient discharged in stable condition.      FINAL CLINICAL IMPRESSION(S) / ED DIAGNOSES   Final diagnoses:  Acute nonintractable headache, unspecified headache type     Rx / DC Orders   ED Discharge Orders     None        Note:  This document was prepared using Dragon voice recognition software and may include unintentional dictation errors.   Trinna Post, MD 01/16/23 [phone]

## 2023-02-03 ENCOUNTER — Other Ambulatory Visit (HOSPITAL_COMMUNITY): Payer: Self-pay | Admitting: Radiology

## 2023-02-03 DIAGNOSIS — R7401 Elevation of levels of liver transaminase levels: Secondary | ICD-10-CM

## 2023-02-03 DIAGNOSIS — R748 Abnormal levels of other serum enzymes: Secondary | ICD-10-CM

## 2023-02-04 ENCOUNTER — Other Ambulatory Visit: Payer: Self-pay

## 2023-02-04 ENCOUNTER — Ambulatory Visit (HOSPITAL_COMMUNITY)
Admission: RE | Admit: 2023-02-04 | Discharge: 2023-02-04 | Disposition: A | Discharge status: Home / Self Care | Payer: Medicare PPO | Source: Ambulatory Visit | Attending: Internal Medicine | Admitting: Internal Medicine

## 2023-02-04 DIAGNOSIS — K759 Inflammatory liver disease, unspecified: Secondary | ICD-10-CM | POA: Diagnosis not present

## 2023-02-04 DIAGNOSIS — M797 Fibromyalgia: Secondary | ICD-10-CM | POA: Insufficient documentation

## 2023-02-04 DIAGNOSIS — F32A Depression, unspecified: Secondary | ICD-10-CM | POA: Insufficient documentation

## 2023-02-04 DIAGNOSIS — R748 Abnormal levels of other serum enzymes: Secondary | ICD-10-CM | POA: Insufficient documentation

## 2023-02-04 DIAGNOSIS — K7589 Other specified inflammatory liver diseases: Secondary | ICD-10-CM | POA: Insufficient documentation

## 2023-02-04 DIAGNOSIS — R7401 Elevation of levels of liver transaminase levels: Secondary | ICD-10-CM | POA: Insufficient documentation

## 2023-02-04 DIAGNOSIS — I10 Essential (primary) hypertension: Secondary | ICD-10-CM | POA: Insufficient documentation

## 2023-02-04 DIAGNOSIS — Z8249 Family history of ischemic heart disease and other diseases of the circulatory system: Secondary | ICD-10-CM | POA: Diagnosis not present

## 2023-02-04 DIAGNOSIS — G500 Trigeminal neuralgia: Secondary | ICD-10-CM | POA: Insufficient documentation

## 2023-02-04 DIAGNOSIS — F419 Anxiety disorder, unspecified: Secondary | ICD-10-CM | POA: Diagnosis not present

## 2023-02-04 DIAGNOSIS — G2581 Restless legs syndrome: Secondary | ICD-10-CM | POA: Insufficient documentation

## 2023-02-04 LAB — CBC
HCT: 35.9 % — ABNORMAL LOW (ref 36.0–46.0)
Hemoglobin: 11.2 g/dL — ABNORMAL LOW (ref 12.0–15.0)
MCH: 28.6 pg (ref 26.0–34.0)
MCHC: 31.2 g/dL (ref 30.0–36.0)
MCV: 91.8 fL (ref 80.0–100.0)
Platelets: 207 10*3/uL (ref 150–400)
RBC: 3.91 MIL/uL (ref 3.87–5.11)
RDW: 14.8 % (ref 11.5–15.5)
WBC: 4.3 10*3/uL (ref 4.0–10.5)
nRBC: 0 % (ref 0.0–0.2)

## 2023-02-04 LAB — PROTIME-INR
INR: 1 (ref 0.8–1.2)
Prothrombin Time: 13.5 s (ref 11.4–15.2)

## 2023-02-04 MED ORDER — FENTANYL CITRATE (PF) 100 MCG/2ML IJ SOLN
INTRAMUSCULAR | Status: AC | PRN
  Administered 2023-02-04: 50 ug via INTRAVENOUS

## 2023-02-04 MED ORDER — LIDOCAINE HCL (PF) 1 % IJ SOLN
INTRAMUSCULAR | Status: AC
  Filled 2023-02-04: qty 30

## 2023-02-04 MED ORDER — MIDAZOLAM HCL 2 MG/2ML IJ SOLN
INTRAMUSCULAR | Status: AC
Start: 2023-02-04 — End: ?
  Filled 2023-02-04: qty 2

## 2023-02-04 MED ORDER — SODIUM CHLORIDE 0.9 % IV SOLN: INTRAVENOUS | Status: DC

## 2023-02-04 MED ORDER — LIDOCAINE HCL (PF) 1 % IJ SOLN
10.0000 mL | Freq: Once | INTRAMUSCULAR | Status: DC
Start: 2023-02-04 — End: 2023-02-05

## 2023-02-04 MED ORDER — MIDAZOLAM HCL 2 MG/2ML IJ SOLN
INTRAMUSCULAR | Status: AC | PRN
  Administered 2023-02-04: 1 mg via INTRAVENOUS

## 2023-02-04 MED ORDER — FENTANYL CITRATE (PF) 100 MCG/2ML IJ SOLN
INTRAMUSCULAR | Status: AC
  Filled 2023-02-04: qty 2

## 2023-02-04 NOTE — Procedures (Signed)
 Interventional Radiology Procedure Note  Procedure: US  guided biopsy of right liver, medical liver Complications: None EBL: None Recommendations: - Bedrest 2 hours.   - Routine wound care - Follow up pathology - Advance diet   Signed,  Ami Bellman, DO

## 2023-02-04 NOTE — H&P (Signed)
 Chief Complaint: Patient was seen in consultation today for non-focal liver biopsy.   Referring Physician(s): Kriss Estefana DEL  Supervising Physician: Alona Corners  Patient Status: [facility] - Out-pt  History of Present Illness: Linda Gomez is a 58 y.o. female with a medical history significant for anxiety/depression, HTN, fibromyalgia, RLS and trigeminal neuralgia with migraines. She was referred to GI for elevated liver enzymes and an extensive serological work up was unrevealing. An US  abdomen 11/13/22 showed normal findings.   Interventional Radiology has been asked to evaluate this patient for an image-guided non-focal liver biopsy for further work up.   Past Medical History:  Diagnosis Date   Allergy    Anxiety    Arthritis    Bilateral myofascial pain    Depression    Fibromyalgia    GERD (gastroesophageal reflux disease)    Hyperlipidemia    Hypertension    Neuromuscular disorder (HCC)    Restless leg syndrome    Trigeminal neuralgia    Trigeminal neuralgia    Trigeminal neuralgia    Trigeminal neuropathy     Past Surgical History:  Procedure Laterality Date   BRAIN SURGERY     CESAREAN SECTION      Allergies: Patient has no known allergies.  Medications: Prior to Admission medications   Medication Sig Start Date End Date Taking? Authorizing Provider  Biotin 89999 MCG TBDP Take 1 tablet by mouth daily.   Yes [provider]  calcium carbonate (OSCAL) 1500 (600 Ca) MG TABS tablet Take 600 mg of elemental calcium by mouth 2 (two) times daily with a meal.   Yes [provider]  Cholecalciferol (D 1000) 25 MCG (1000 UT) CHEW Chew 1,000 Units by mouth daily. 05/28/16  Yes [provider]  cyclobenzaprine (FLEXERIL) 10 MG tablet Take 10 mg by mouth 2 (two) times daily as needed. 06/29/22  Yes [provider]  DULoxetine (CYMBALTA) 30 MG capsule Take 30 mg by mouth daily.   Yes [provider]  fexofenadine  (ALLEGRA) 180 MG tablet Take 180 mg by mouth daily.   Yes [provider]  fluticasone (FLONASE) 50 MCG/ACT nasal spray Place into the nose.   Yes [provider]  magnesium gluconate (MAGONATE) 500 MG tablet Take 500 mg by mouth 2 (two) times daily.   Yes [provider]  Multiple Vitamin (MULTIVITAMIN) tablet Take 1 tablet by mouth daily.   Yes [provider]  omeprazole (PRILOSEC) 20 MG capsule Take 20 mg by mouth daily as needed.    Yes [provider]  propranolol  ER (INDERAL  LA) 80 MG 24 hr capsule TAKE 1 CAPSULE(80 MG) BY MOUTH DAILY 10/30/22  Yes Ladona Heinz, MD  rizatriptan (MAXALT) 10 MG tablet Take 10 mg by mouth as needed.   Yes [provider]  rosuvastatin (CRESTOR) 40 MG tablet Take 40 mg by mouth daily.   Yes [provider]  topiramate (TOPAMAX) 200 MG tablet Take 100 mg by mouth at bedtime. 3 tablets at bedtime.   Yes [provider]  vitamin C (ASCORBIC ACID) 250 MG tablet Take 250 mg by mouth daily.   Yes [provider]  Vitamin E 45 MG (100 UNIT) CAPS Take by mouth.   Yes [provider]  Black Pepper-Turmeric (TURMERIC COMPLEX/BLACK PEPPER PO) Take by mouth 2 (two) times a day.    [provider]  Fremanezumab-vfrm 225 MG/1.5ML SOSY Inject 225 mg into the skin every 28 (twenty-eight) days. 12/18/17   [provider]  Sodium Caprylate POWD Take 1 tablet by mouth daily. 11/19/21   [provider]     Family History  Problem Relation Age of Onset   Hyperlipidemia Mother    Hypertension Mother    Diabetes Mother    Cancer Mother    Lung cancer Father    Diabetes Father     Social History   Socioeconomic History   Marital status: Married    Spouse name: Not on file   Number of children: Not on file   Years of education: Not on file   Highest education level: Not on file  Occupational History   Not on file  Tobacco Use   Smoking status: Never    Smokeless tobacco: Never  Vaping Use   Vaping status: Never Used  Substance and Sexual Activity   Alcohol use: Yes    Alcohol/week: 1.0 standard drink of alcohol    Types: 1 Glasses of wine per week   Drug use: Never   Sexual activity: Not on file  Other Topics Concern   Not on file  Social History Narrative   Not on file   Social Drivers of Health   Financial Resource Strain: Low Risk  (11/01/2021)   Received from [facility], [facility]   Overall Financial Resource Strain (CARDIA)    Difficulty of Paying Living Expenses: Not hard at all  Food Insecurity: No Food Insecurity (11/01/2021)   Received [facility], [facility]   Hunger Vital Sign    Worried About Running Out of Food in the Last Year: Never true    Ran Out of Food in the Last Year: Never true  Transportation Needs: No Transportation Needs (11/01/2021)   Received from [facility], [facility]   [facility] - Transportation    In the past 12 months, has lack of transportation kept you from medical appointments or from getting medications?: No    Lack of Transportation (Non-Medical): No  Physical Activity: Not on file  Stress: Not on file  Social Connections: Unknown (03/03/2017)   Received from [facility], [facility]   Social Connection and Isolation Panel [NHANES]    Frequency of Communication with Friends and Family: Not on file    Frequency of Social Gatherings with Friends and Family: Not on file    Attends Religious Services: Not on file    Active Member of Clubs or Organizations: Not on file    Attends Banker Meetings: Not on file    Marital Status: Married    Review of Systems: A 12 point ROS discussed and pertinent positives are indicated in the HPI above.  All other systems are negative.  Review of Systems  All other systems reviewed and are  negative.   Vital Signs: BP 120/76   Pulse 64   Temp 98.1 F (36.7 C) (Oral)   Resp 16   Ht 5' 3 (1.6 m)   Wt 150 lb (68 kg)   SpO2 100%   BMI 26.57 kg/m   Physical Exam Constitutional:      General: She is not in acute distress.    Appearance: She is not ill-appearing.  HENT:     Mouth/Throat:     Mouth: Mucous membranes are moist.     Pharynx: Oropharynx is clear.  Cardiovascular:     Pulses: Normal pulses.  Pulmonary:     Effort: Pulmonary effort is normal.  Abdominal:     Tenderness: There is no abdominal tenderness.  Skin:    General: Skin is warm and dry.  Neurological:     Mental Status: She is alert and oriented to person, place, and time.  Psychiatric:        Mood and Affect: Mood normal.        Behavior: Behavior normal.        Thought Content: Thought content normal.        Judgment: Judgment normal.     Imaging: CT Head Wo Contrast Result Date: 01/16/2023 CLINICAL DATA:  Headache, which is different from the patient's migraines EXAM: CT HEAD WITHOUT CONTRAST TECHNIQUE: Contiguous axial images were obtained from the base of the skull through the vertex without intravenous contrast. RADIATION DOSE REDUCTION: This exam was performed according to the departmental dose-optimization program which includes automated exposure control, adjustment of the mA and/or kV according to patient size and/or use of iterative reconstruction technique. COMPARISON:  None Available. FINDINGS: Brain: No evidence of acute infarction, hemorrhage, mass, mass effect, or midline shift. No hydrocephalus or extra-axial fluid collection. Pituitary and craniocervical junction within normal limits. Vascular: No hyperdense vessel. Skull: Negative for fracture or focal lesion. Sinuses/Orbits: No acute finding. Other: Trace fluid in the right mastoid air cells. IMPRESSION: No acute intracranial process. Electronically Signed   By: Donald Campion M.D.   On: 01/16/2023 16:18     Labs:  CBC: Recent Labs    02/04/23 1109  WBC 4.3  HGB 11.2*  HCT 35.9*  PLT 207    COAGS: Recent Labs    02/04/23 1109  INR 1.0    BMP: No results for input(s): NA, K, CL, CO2, GLUCOSE, BUN, CALCIUM, CREATININE, GFRNONAA, GFRAA in the last 8760 hours.  Invalid input(s): CMP  LIVER FUNCTION TESTS: No results for input(s): BILITOT, AST, ALT, ALKPHOS, PROT, ALBUMIN in the last 8760 hours.  TUMOR MARKERS: No results for input(s): AFPTM, CEA, CA199, CHROMGRNA in the last 8760 hours.  Assessment and Plan:  Elevated liver enzymes of unknown etiology: Erminio FABIENE Lesches, 58 year old female, presents today to the [facility] Interventional Radiology department for an image-guided non-focal liver biopsy.   Risks and benefits of this procedure were discussed with the patient and/or patient's family including, but not limited to bleeding, infection, damage to adjacent structures or low yield requiring additional tests.  All of the questions were answered and there is agreement to proceed. She has been NPO. She does not take any blood-thinning medications.   Consent signed and in chart.  Thank you for this interesting consult.  I greatly enjoyed meeting Brocha Gilliam and look forward to participating in their care.  A copy of this report was sent to the requesting provider on this date.  Electronically Signed: Warren Dais, AGACNP-BC 02/04/2023, 12:03 PM   I spent a total of  30 Minutes   in face to face in clinical consultation, greater than 50% of which was counseling/coordinating care for non-focal liver biopsy

## 2023-02-05 LAB — SURGICAL PATHOLOGY

## 2023-03-13 DIAGNOSIS — N952 Postmenopausal atrophic vaginitis: Secondary | ICD-10-CM | POA: Diagnosis not present

## 2023-03-13 DIAGNOSIS — Z01419 Encounter for gynecological examination (general) (routine) without abnormal findings: Secondary | ICD-10-CM | POA: Diagnosis not present

## 2023-03-13 DIAGNOSIS — N941 Unspecified dyspareunia: Secondary | ICD-10-CM | POA: Diagnosis not present

## 2023-03-27 DIAGNOSIS — Z79899 Other long term (current) drug therapy: Secondary | ICD-10-CM | POA: Diagnosis not present

## 2023-03-27 DIAGNOSIS — R748 Abnormal levels of other serum enzymes: Secondary | ICD-10-CM | POA: Diagnosis not present

## 2023-04-22 DIAGNOSIS — Z23 Encounter for immunization: Secondary | ICD-10-CM | POA: Diagnosis not present

## 2023-04-22 DIAGNOSIS — M797 Fibromyalgia: Secondary | ICD-10-CM | POA: Diagnosis not present

## 2023-04-22 DIAGNOSIS — G43909 Migraine, unspecified, not intractable, without status migrainosus: Secondary | ICD-10-CM | POA: Diagnosis not present

## 2023-04-22 DIAGNOSIS — Z79899 Other long term (current) drug therapy: Secondary | ICD-10-CM | POA: Diagnosis not present

## 2023-04-22 DIAGNOSIS — I10 Essential (primary) hypertension: Secondary | ICD-10-CM | POA: Diagnosis not present

## 2023-04-22 DIAGNOSIS — Z0000 Encounter for general adult medical examination without abnormal findings: Secondary | ICD-10-CM | POA: Diagnosis not present

## 2023-04-22 DIAGNOSIS — R7303 Prediabetes: Secondary | ICD-10-CM | POA: Diagnosis not present

## 2023-04-22 DIAGNOSIS — E782 Mixed hyperlipidemia: Secondary | ICD-10-CM | POA: Diagnosis not present

## 2023-06-13 DIAGNOSIS — R748 Abnormal levels of other serum enzymes: Secondary | ICD-10-CM | POA: Diagnosis not present

## 2023-06-20 DIAGNOSIS — K921 Melena: Secondary | ICD-10-CM | POA: Diagnosis not present

## 2023-06-20 DIAGNOSIS — R945 Abnormal results of liver function studies: Secondary | ICD-10-CM | POA: Diagnosis not present

## 2023-07-29 ENCOUNTER — Other Ambulatory Visit: Payer: Self-pay | Admitting: Cardiology

## 2023-07-29 DIAGNOSIS — R002 Palpitations: Secondary | ICD-10-CM

## 2023-07-29 DIAGNOSIS — I10 Essential (primary) hypertension: Secondary | ICD-10-CM

## 2023-07-29 DIAGNOSIS — G43019 Migraine without aura, intractable, without status migrainosus: Secondary | ICD-10-CM

## 2023-08-26 ENCOUNTER — Other Ambulatory Visit: Payer: Self-pay | Admitting: Cardiology

## 2023-08-26 DIAGNOSIS — I10 Essential (primary) hypertension: Secondary | ICD-10-CM

## 2023-08-26 DIAGNOSIS — G43019 Migraine without aura, intractable, without status migrainosus: Secondary | ICD-10-CM

## 2023-08-26 DIAGNOSIS — R002 Palpitations: Secondary | ICD-10-CM

## 2023-08-28 NOTE — Telephone Encounter (Signed)
 Rx Refill

## 2023-09-19 DIAGNOSIS — R7401 Elevation of levels of liver transaminase levels: Secondary | ICD-10-CM | POA: Diagnosis not present

## 2023-09-26 DIAGNOSIS — K5909 Other constipation: Secondary | ICD-10-CM | POA: Diagnosis not present

## 2023-09-26 DIAGNOSIS — R748 Abnormal levels of other serum enzymes: Secondary | ICD-10-CM | POA: Diagnosis not present

## 2023-10-15 DIAGNOSIS — R7303 Prediabetes: Secondary | ICD-10-CM | POA: Diagnosis not present

## 2023-10-15 DIAGNOSIS — H2513 Age-related nuclear cataract, bilateral: Secondary | ICD-10-CM | POA: Diagnosis not present

## 2023-10-15 DIAGNOSIS — H04123 Dry eye syndrome of bilateral lacrimal glands: Secondary | ICD-10-CM | POA: Diagnosis not present

## 2023-10-15 DIAGNOSIS — H52223 Regular astigmatism, bilateral: Secondary | ICD-10-CM | POA: Diagnosis not present

## 2023-10-15 DIAGNOSIS — H5203 Hypermetropia, bilateral: Secondary | ICD-10-CM | POA: Diagnosis not present

## 2023-10-23 DIAGNOSIS — G43909 Migraine, unspecified, not intractable, without status migrainosus: Secondary | ICD-10-CM | POA: Diagnosis not present

## 2023-10-23 DIAGNOSIS — L819 Disorder of pigmentation, unspecified: Secondary | ICD-10-CM | POA: Diagnosis not present

## 2023-10-23 DIAGNOSIS — Z23 Encounter for immunization: Secondary | ICD-10-CM | POA: Diagnosis not present

## 2023-10-23 DIAGNOSIS — I10 Essential (primary) hypertension: Secondary | ICD-10-CM | POA: Diagnosis not present

## 2023-10-23 DIAGNOSIS — R7303 Prediabetes: Secondary | ICD-10-CM | POA: Diagnosis not present

## 2023-10-23 DIAGNOSIS — E782 Mixed hyperlipidemia: Secondary | ICD-10-CM | POA: Diagnosis not present

## 2023-12-29 ENCOUNTER — Other Ambulatory Visit: Payer: Self-pay | Admitting: Family Medicine

## 2023-12-29 DIAGNOSIS — Z1231 Encounter for screening mammogram for malignant neoplasm of breast: Secondary | ICD-10-CM

## 2024-01-23 ENCOUNTER — Ambulatory Visit

## 2024-02-02 ENCOUNTER — Ambulatory Visit
Admission: RE | Admit: 2024-02-02 | Discharge: 2024-02-02 | Disposition: A | Discharge status: Home / Self Care | Source: Ambulatory Visit | Attending: Family Medicine | Admitting: Family Medicine

## 2024-02-02 DIAGNOSIS — Z1231 Encounter for screening mammogram for malignant neoplasm of breast: Secondary | ICD-10-CM

## 2024-03-30 ENCOUNTER — Ambulatory Visit: Admitting: Neurology

## 2024-04-14 ENCOUNTER — Ambulatory Visit: Admitting: Neurology

## 2024-05-26 ENCOUNTER — Ambulatory Visit: Admitting: Dermatology
